# Patient Record
Sex: Female | Born: 1935 | Race: White | Hispanic: No | Smoking: Former smoker
Health system: Southern US, Community
[De-identification: ages and names within clinical notes are randomized; demographics above are authoritative.]

## PROBLEM LIST (undated history)

## (undated) DIAGNOSIS — K259 Gastric ulcer, unspecified as acute or chronic, without hemorrhage or perforation: Secondary | ICD-10-CM

## (undated) DIAGNOSIS — G9340 Encephalopathy, unspecified: Secondary | ICD-10-CM

## (undated) DIAGNOSIS — I10 Essential (primary) hypertension: Secondary | ICD-10-CM

## (undated) DIAGNOSIS — D509 Iron deficiency anemia, unspecified: Secondary | ICD-10-CM

## (undated) DIAGNOSIS — I6529 Occlusion and stenosis of unspecified carotid artery: Secondary | ICD-10-CM

## (undated) DIAGNOSIS — K2970 Gastritis, unspecified, without bleeding: Secondary | ICD-10-CM

## (undated) DIAGNOSIS — N183 Chronic kidney disease, stage 3 (moderate): Secondary | ICD-10-CM

## (undated) DIAGNOSIS — D62 Acute posthemorrhagic anemia: Secondary | ICD-10-CM

## (undated) DIAGNOSIS — E079 Disorder of thyroid, unspecified: Secondary | ICD-10-CM

## (undated) DIAGNOSIS — M1990 Unspecified osteoarthritis, unspecified site: Secondary | ICD-10-CM

## (undated) DIAGNOSIS — R9431 Abnormal electrocardiogram [ECG] [EKG]: Secondary | ICD-10-CM

## (undated) DIAGNOSIS — I248 Other forms of acute ischemic heart disease: Secondary | ICD-10-CM

## (undated) HISTORY — PX: ABDOMINAL HYSTERECTOMY: SHX81

## (undated) HISTORY — DX: Unspecified osteoarthritis, unspecified site: M19.90

---

## 2015-02-24 ENCOUNTER — Telehealth: Payer: Self-pay | Admitting: *Deleted

## 2015-02-24 NOTE — Telephone Encounter (Signed)
I have spoken to patient's daughter to advise of time and date of appointment with Lawson FiscalLori Hvozdovic, PA-C. She verbalizes understanding. [place] and will have records sent here before appointment.

## 2015-02-24 NOTE — Telephone Encounter (Signed)
-----   Message from Beverley FiedlerJay M Pyrtle, MD sent at 02/24/2015 12:30 PM EDT ----- Regarding: New pt This lady is family friend of Josh Monday She recently dx with PUD, bleeding Needs to be seen, was told she would need to wait until Jan. Needs to be fit in with me or APP while I am in clinic. Her daughter is Sara ChuJennifer Welch is the contact at [phone]  Thanks JMP

## 2015-03-02 ENCOUNTER — Inpatient Hospital Stay (HOSPITAL_COMMUNITY)
Admission: EM | Admit: 2015-03-02 | Discharge: 2015-03-06 | DRG: 377 | Disposition: A | Discharge status: Home Health | Payer: Medicare Other | Attending: Internal Medicine | Admitting: Internal Medicine

## 2015-03-02 ENCOUNTER — Emergency Department (HOSPITAL_COMMUNITY): Payer: Medicare Other

## 2015-03-02 ENCOUNTER — Inpatient Hospital Stay (HOSPITAL_COMMUNITY): Payer: Medicare Other

## 2015-03-02 ENCOUNTER — Encounter (HOSPITAL_COMMUNITY): Payer: Self-pay | Admitting: Emergency Medicine

## 2015-03-02 DIAGNOSIS — R531 Weakness: Secondary | ICD-10-CM | POA: Diagnosis not present

## 2015-03-02 DIAGNOSIS — Z800 Family history of malignant neoplasm of digestive organs: Secondary | ICD-10-CM | POA: Diagnosis not present

## 2015-03-02 DIAGNOSIS — K922 Gastrointestinal hemorrhage, unspecified: Secondary | ICD-10-CM | POA: Diagnosis not present

## 2015-03-02 DIAGNOSIS — I248 Other forms of acute ischemic heart disease: Secondary | ICD-10-CM | POA: Diagnosis present

## 2015-03-02 DIAGNOSIS — I6529 Occlusion and stenosis of unspecified carotid artery: Secondary | ICD-10-CM | POA: Diagnosis present

## 2015-03-02 DIAGNOSIS — D509 Iron deficiency anemia, unspecified: Secondary | ICD-10-CM | POA: Diagnosis present

## 2015-03-02 DIAGNOSIS — Z8249 Family history of ischemic heart disease and other diseases of the circulatory system: Secondary | ICD-10-CM | POA: Diagnosis not present

## 2015-03-02 DIAGNOSIS — K253 Acute gastric ulcer without hemorrhage or perforation: Secondary | ICD-10-CM | POA: Diagnosis present

## 2015-03-02 DIAGNOSIS — Z8719 Personal history of other diseases of the digestive system: Secondary | ICD-10-CM | POA: Diagnosis not present

## 2015-03-02 DIAGNOSIS — D649 Anemia, unspecified: Secondary | ICD-10-CM | POA: Diagnosis present

## 2015-03-02 DIAGNOSIS — J189 Pneumonia, unspecified organism: Secondary | ICD-10-CM | POA: Diagnosis present

## 2015-03-02 DIAGNOSIS — Y95 Nosocomial condition: Secondary | ICD-10-CM | POA: Diagnosis present

## 2015-03-02 DIAGNOSIS — Z8711 Personal history of peptic ulcer disease: Secondary | ICD-10-CM

## 2015-03-02 DIAGNOSIS — G9340 Encephalopathy, unspecified: Secondary | ICD-10-CM

## 2015-03-02 DIAGNOSIS — D62 Acute posthemorrhagic anemia: Secondary | ICD-10-CM | POA: Diagnosis present

## 2015-03-02 DIAGNOSIS — R4182 Altered mental status, unspecified: Secondary | ICD-10-CM

## 2015-03-02 DIAGNOSIS — I639 Cerebral infarction, unspecified: Secondary | ICD-10-CM

## 2015-03-02 DIAGNOSIS — K2971 Gastritis, unspecified, with bleeding: Principal | ICD-10-CM | POA: Diagnosis present

## 2015-03-02 DIAGNOSIS — R7989 Other specified abnormal findings of blood chemistry: Secondary | ICD-10-CM | POA: Diagnosis present

## 2015-03-02 DIAGNOSIS — Z79899 Other long term (current) drug therapy: Secondary | ICD-10-CM

## 2015-03-02 DIAGNOSIS — R9431 Abnormal electrocardiogram [ECG] [EKG]: Secondary | ICD-10-CM

## 2015-03-02 DIAGNOSIS — N183 Chronic kidney disease, stage 3 unspecified: Secondary | ICD-10-CM | POA: Diagnosis present

## 2015-03-02 DIAGNOSIS — K2970 Gastritis, unspecified, without bleeding: Secondary | ICD-10-CM | POA: Diagnosis present

## 2015-03-02 DIAGNOSIS — R930 Abnormal findings on diagnostic imaging of skull and head, not elsewhere classified: Secondary | ICD-10-CM

## 2015-03-02 HISTORY — DX: Acute posthemorrhagic anemia: D62

## 2015-03-02 HISTORY — DX: Iron deficiency anemia, unspecified: D50.9

## 2015-03-02 HISTORY — DX: Gastritis, unspecified, without bleeding: K29.70

## 2015-03-02 HISTORY — DX: Encephalopathy, unspecified: G93.40

## 2015-03-02 HISTORY — DX: Abnormal electrocardiogram (ECG) (EKG): R94.31

## 2015-03-02 HISTORY — DX: Other forms of acute ischemic heart disease: I24.8

## 2015-03-02 HISTORY — DX: Gastric ulcer, unspecified as acute or chronic, without hemorrhage or perforation: K25.9

## 2015-03-02 HISTORY — DX: Occlusion and stenosis of unspecified carotid artery: I65.29

## 2015-03-02 HISTORY — DX: Chronic kidney disease, stage 3 (moderate): N18.3

## 2015-03-02 LAB — URINALYSIS, ROUTINE W REFLEX MICROSCOPIC
Bilirubin Urine: NEGATIVE
Glucose, UA: NEGATIVE mg/dL
Hgb urine dipstick: NEGATIVE
KETONES UR: NEGATIVE mg/dL
LEUKOCYTES UA: NEGATIVE
NITRITE: NEGATIVE
PROTEIN: NEGATIVE mg/dL
Specific Gravity, Urine: 1.012 (ref 1.005–1.030)
Urobilinogen, UA: 0.2 mg/dL (ref 0.0–1.0)
pH: 6 (ref 5.0–8.0)

## 2015-03-02 LAB — LACTIC ACID, PLASMA: LACTIC ACID, VENOUS: 1.5 mmol/L (ref 0.5–2.0)

## 2015-03-02 LAB — CBC
HCT: 15.1 % — ABNORMAL LOW (ref 36.0–46.0)
HEMOGLOBIN: 4.7 g/dL — AB (ref 12.0–15.0)
MCH: 28.7 pg (ref 26.0–34.0)
MCHC: 31.1 g/dL (ref 30.0–36.0)
MCV: 92.1 fL (ref 78.0–100.0)
PLATELETS: 291 10*3/uL (ref 150–400)
RBC: 1.64 MIL/uL — AB (ref 3.87–5.11)
RDW: 21.3 % — ABNORMAL HIGH (ref 11.5–15.5)
WBC: 9.6 10*3/uL (ref 4.0–10.5)

## 2015-03-02 LAB — COMPREHENSIVE METABOLIC PANEL
ALK PHOS: 80 U/L (ref 38–126)
ALT: 11 U/L — AB (ref 14–54)
ANION GAP: 4 — AB (ref 5–15)
AST: 15 U/L (ref 15–41)
Albumin: 2.2 g/dL — ABNORMAL LOW (ref 3.5–5.0)
BILIRUBIN TOTAL: 0.4 mg/dL (ref 0.3–1.2)
BUN: 21 mg/dL — ABNORMAL HIGH (ref 6–20)
CALCIUM: 7.8 mg/dL — AB (ref 8.9–10.3)
CO2: 24 mmol/L (ref 22–32)
CREATININE: 0.55 mg/dL (ref 0.44–1.00)
Chloride: 111 mmol/L (ref 101–111)
Glucose, Bld: 130 mg/dL — ABNORMAL HIGH (ref 65–99)
Potassium: 3.7 mmol/L (ref 3.5–5.1)
Sodium: 139 mmol/L (ref 135–145)
TOTAL PROTEIN: 4.7 g/dL — AB (ref 6.5–8.1)

## 2015-03-02 LAB — POC OCCULT BLOOD, ED: Fecal Occult Bld: POSITIVE — AB

## 2015-03-02 LAB — MRSA PCR SCREENING: MRSA by PCR: NEGATIVE

## 2015-03-02 LAB — PROTIME-INR
INR: 1.16 (ref 0.00–1.49)
Prothrombin Time: 14.9 seconds (ref 11.6–15.2)

## 2015-03-02 LAB — TROPONIN I: Troponin I: <0.03 ng/mL (ref <0.031)

## 2015-03-02 LAB — ABO/RH: ABO/RH(D): A NEG

## 2015-03-02 LAB — PREPARE RBC (CROSSMATCH)

## 2015-03-02 MED ORDER — PANTOPRAZOLE SODIUM 40 MG IV SOLR: 40.0000 mg | Freq: Two times a day (BID) | INTRAVENOUS | Status: DC

## 2015-03-02 MED ORDER — PIPERACILLIN-TAZOBACTAM 3.375 G IVPB 30 MIN
3.3750 g | Freq: Three times a day (TID) | INTRAVENOUS | Status: DC
  Filled 2015-03-02: qty 50

## 2015-03-02 MED ORDER — FLEET ENEMA 7-19 GM/118ML RE ENEM
1.0000 | ENEMA | Freq: Once | RECTAL | Status: DC | PRN
Start: 2015-03-02 — End: 2015-03-06

## 2015-03-02 MED ORDER — ONDANSETRON HCL 4 MG/2ML IJ SOLN: 4.0000 mg | Freq: Four times a day (QID) | INTRAMUSCULAR | Status: DC | PRN

## 2015-03-02 MED ORDER — VANCOMYCIN HCL IN DEXTROSE 1-5 GM/200ML-% IV SOLN
1000.0000 mg | Freq: Once | INTRAVENOUS | Status: AC
  Administered 2015-03-02: 1000 mg via INTRAVENOUS
  Filled 2015-03-02: qty 200

## 2015-03-02 MED ORDER — SODIUM CHLORIDE 0.9 % IV SOLN
Freq: Once | INTRAVENOUS | Status: AC
Start: 2015-03-02 — End: 2015-03-02
  Administered 2015-03-02: 11:00:00 via INTRAVENOUS

## 2015-03-02 MED ORDER — OXYCODONE HCL 5 MG PO TABS
5.0000 mg | ORAL_TABLET | ORAL | Status: DC | PRN
  Administered 2015-03-03 – 2015-03-04 (×4): 5 mg via ORAL
  Filled 2015-03-02 (×4): qty 1

## 2015-03-02 MED ORDER — CARVEDILOL 3.125 MG PO TABS
3.1250 mg | ORAL_TABLET | Freq: Two times a day (BID) | ORAL | Status: DC
  Administered 2015-03-02 – 2015-03-06 (×8): 3.125 mg via ORAL
  Filled 2015-03-02 (×8): qty 1

## 2015-03-02 MED ORDER — CHLORHEXIDINE GLUCONATE 0.12 % MT SOLN
15.0000 mL | Freq: Two times a day (BID) | OROMUCOSAL | Status: DC
Start: 2015-03-03 — End: 2015-03-06
  Administered 2015-03-03 – 2015-03-06 (×7): 15 mL via OROMUCOSAL
  Filled 2015-03-02 (×7): qty 15

## 2015-03-02 MED ORDER — CETYLPYRIDINIUM CHLORIDE 0.05 % MT LIQD
7.0000 mL | Freq: Two times a day (BID) | OROMUCOSAL | Status: DC
  Administered 2015-03-03 – 2015-03-06 (×6): 7 mL via OROMUCOSAL

## 2015-03-02 MED ORDER — SENNOSIDES-DOCUSATE SODIUM 8.6-50 MG PO TABS: 1.0000 | ORAL_TABLET | Freq: Every evening | ORAL | Status: DC | PRN

## 2015-03-02 MED ORDER — FUROSEMIDE 10 MG/ML IJ SOLN
20.0000 mg | Freq: Once | INTRAMUSCULAR | Status: AC
  Administered 2015-03-02: 20 mg via INTRAVENOUS
  Filled 2015-03-02: qty 2

## 2015-03-02 MED ORDER — PIPERACILLIN-TAZOBACTAM 3.375 G IVPB
3.3750 g | Freq: Once | INTRAVENOUS | Status: AC
  Administered 2015-03-02: 3.375 g via INTRAVENOUS
  Filled 2015-03-02: qty 50

## 2015-03-02 MED ORDER — PIPERACILLIN-TAZOBACTAM 3.375 G IVPB
3.3750 g | Freq: Three times a day (TID) | INTRAVENOUS | Status: DC
  Administered 2015-03-02 – 2015-03-05 (×9): 3.375 g via INTRAVENOUS
  Filled 2015-03-02 (×6): qty 50

## 2015-03-02 MED ORDER — ALBUTEROL SULFATE (2.5 MG/3ML) 0.083% IN NEBU
2.5000 mg | INHALATION_SOLUTION | RESPIRATORY_TRACT | Status: DC | PRN
Start: 2015-03-02 — End: 2015-03-06

## 2015-03-02 MED ORDER — STROKE: EARLY STAGES OF RECOVERY BOOK
Freq: Once | Status: AC
  Administered 2015-03-02: 17:00:00
  Filled 2015-03-02: qty 1

## 2015-03-02 MED ORDER — ONDANSETRON HCL 4 MG PO TABS: 4.0000 mg | ORAL_TABLET | Freq: Four times a day (QID) | ORAL | Status: DC | PRN

## 2015-03-02 MED ORDER — VANCOMYCIN HCL 500 MG IV SOLR
500.0000 mg | Freq: Two times a day (BID) | INTRAVENOUS | Status: DC
  Administered 2015-03-03 – 2015-03-05 (×6): 500 mg via INTRAVENOUS
  Filled 2015-03-02 (×6): qty 500

## 2015-03-02 MED ORDER — ACETAMINOPHEN 500 MG PO TABS: 500.0000 mg | ORAL_TABLET | Freq: Four times a day (QID) | ORAL | Status: DC | PRN

## 2015-03-02 MED ORDER — SODIUM CHLORIDE 0.9 % IV SOLN
8.0000 mg/h | INTRAVENOUS | Status: DC
  Administered 2015-03-02 – 2015-03-03 (×2): 8 mg/h via INTRAVENOUS
  Filled 2015-03-02 (×3): qty 80

## 2015-03-02 MED ORDER — PANTOPRAZOLE SODIUM 40 MG IV SOLR
80.0000 mg | Freq: Once | INTRAVENOUS | Status: AC
  Administered 2015-03-02: 80 mg via INTRAVENOUS
  Filled 2015-03-02: qty 80

## 2015-03-02 MED ORDER — SORBITOL 70 % SOLN
30.0000 mL | Freq: Every day | Status: DC | PRN
  Administered 2015-03-05: 30 mL via ORAL
  Filled 2015-03-02: qty 30

## 2015-03-02 MED ORDER — SODIUM CHLORIDE 0.9 % IJ SOLN
3.0000 mL | Freq: Two times a day (BID) | INTRAMUSCULAR | Status: DC
  Administered 2015-03-02 – 2015-03-06 (×3): 3 mL via INTRAVENOUS

## 2015-03-02 NOTE — H&P (Signed)
[facility] History and Physical  Erica Owen NWG:[id number] DOB: 12/15/1930 DOA: 03/02/2015  Referring physician: Dr Patria Mane PCP: Nadean Corwin, MD   Chief Complaint: Fatigue  HPI: Erica Owen is a 81 y.o. female  With no significant prior medical history as per daughter hasn't seen a doctor 55 years presenting to the ED with generalized fatigue and confusion. Patient recently hospitalized at [facility] for 2 nonbleeding gastric ulcers that were created with a clean ulcer base one measuring 10 mm and another one 12 mm the EGD with gastritis. Patient received a total of 6 units packed red blood cells during the hospitalization and on discharge hemoglobin went from 4.7 on admission to 9.3 on discharge, patient was hospitalized there from 02/21/2015 - 02/24/2015. Patient was subsequently discharged home on PPI and Carafate and per family required cauterization. Patient had doing been doing better and per family was noted to have a cough prior to discharge however had 2 negative chest x-rays. Patient presented to the ED today secondary to worsening generalized weakness and increasing confusion per family the patient was noted to be disoriented and very weak. Patient denied any fevers, no chills, no nausea, no vomiting, no chest pain, no shortness of breath, no abdominal pain, no diarrhea, no constipation, no melena, no hematemesis, no hematochezia, no dizziness, no lightheadedness. Patient does endorse a nonproductive cough. Patient denies any NSAIDs use. Patient states when he uses Tylenol for pain. Patient was seen in the emergency room CBC done had a hemoglobin of 4.7 MCV of 92.1 otherwise was within normal limits. Compressive metabolic profile had a BUN of 21 calcium of 7.8 glucose of 130 abdomen of 2.2 ALT of 11 total protein of 4.7 otherwise was within normal limits. Lactic acid was 1.5. Troponin was less than 0.03. Urinalysis was nitrite negative leukocytes negative. Chest x-ray was  concerning for posterior left basilar pneumonia. CT head showed a suspicion for small early infarct at the gray-white junction of the right posterior frontal-anterior temporal junction. No hemorrhage or mass effect. 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 EKG showed ST depression in leads 2,3, aVF, V4 through V6 and T-wave inversions in leads 1,2 ,V4 through V6. ED physician consulted with gastroenterology. Patient was started on transfusion of 3 units of packed red blood cells. [facility] were called to admit the patient for further evaluation and management.     Review of Systems: Per HPI otherwise negative. Constitutional:  No weight loss, night sweats, Fevers, chills, fatigue.  HEENT:  No headaches, Difficulty swallowing,Tooth/dental problems,Sore throat,  No sneezing, itching, ear ache, nasal congestion, post nasal drip,  Cardio-vascular:  No chest pain, Orthopnea, PND, swelling in lower extremities, anasarca, dizziness, palpitations  GI:  No heartburn, indigestion, abdominal pain, nausea, vomiting, diarrhea, change in bowel habits, loss of appetite  Resp:  No shortness of breath with exertion or at rest. No excess mucus, no productive cough, No non-productive cough, No coughing up of blood.No change in color of mucus.No wheezing.No chest wall deformity  Skin:  no rash or lesions.  GU:  no dysuria, change in color of urine, no urgency or frequency. No flank pain.  Musculoskeletal:  No joint pain or swelling. No decreased range of motion. No back pain.  Psych:  No change in mood or affect. No depression or anxiety. No memory loss.   Past Medical History  Diagnosis Date  . Gastric ulcer   . Acute blood loss anemia 03/02/2015   History reviewed. No pertinent past surgical  history. Social History:  reports that she has never smoked. She does not have any smokeless tobacco history on file. She reports that she does not drink alcohol or use illicit  drugs.  No Known Allergies  Family History  Problem Relation Age of Onset  . Lupus Mother   . Kidney failure Mother   . Heart attack Father       Prior to Admission medications   Medication Sig Start Date End Date Taking? Authorizing Provider  acetaminophen (TYLENOL) 500 MG tablet Take 500 mg by mouth every 6 (six) hours as needed for mild pain.   Yes Historical Provider, MD  pantoprazole (PROTONIX) 40 MG tablet Take 40 mg by mouth 2 (two) times daily.  02/24/15  Yes Historical Provider, MD  sucralfate (CARAFATE) 1 G tablet Take 1 g by mouth 4 (four) times daily -  with meals and at bedtime.  02/24/15  Yes Historical Provider, MD   Physical Exam: Filed Vitals:   03/02/15 1215 03/02/15 1230 03/02/15 1257 03/02/15 1300  BP: 114/67 130/76  133/70  Pulse: 91 94  93  Temp: 99.1 F (37.3 C)  99.1 F (37.3 C)   TempSrc:      Resp: SpO2: 97% 100%  98%    Wt Readings from Last 3 Encounters:  No data found for Wt    General:  Well developed, well nourished, speaking in full sentences in no pulmonary distress. Eyes: PERRLA, EOMI, normal lids, irises & conjunctiva ENT: grossly normal hearing, lips & tongue. Dry mucous membranes. Neck: no LAD, masses or thyromegaly Cardiovascular: RRR, no m/r/g. No LE edema.  Respiratory: CTA bilaterally, no w/r/r. Normal respiratory effort. Abdomen: soft, ntnd, positive bowel sounds, no rebound, no guarding Skin: no rash or induration seen on limited exam Musculoskeletal: grossly normal tone BUE/BLE Psychiatric: grossly normal mood and affect, speech fluent and appropriate Neurologic: Alert and attempted to self and place. Cranial nerves II through XII grossly intact. Sensation is intact. Visual fields are intact. Finger-to-nose is intact. Gait not tested secondary to safety.           Labs on Admission:  Basic Metabolic Panel:  Recent Labs Lab 03/02/15 0902  NA 139  K 3.7  CL 111  CO2 24  GLUCOSE 130*  BUN 21*  CREATININE  0.55  CALCIUM 7.8*   Liver Function Tests:  Recent Labs Lab 03/02/15 0902  AST 15  ALT 11*  ALKPHOS 80  BILITOT 0.4  PROT 4.7*  ALBUMIN 2.2*   No results for input(s): LIPASE, AMYLASE in the last 168 hours. No results for input(s): AMMONIA in the last 168 hours. CBC:  Recent Labs Lab 03/02/15 0902  WBC 9.6  HGB 4.7*  HCT 15.1*  MCV 92.1  PLT 291   Cardiac Enzymes:  Recent Labs Lab 03/02/15 0902  TROPONINI <0.03    BNP (last 3 results) No results for input(s): BNP in the last 8760 hours.  ProBNP (last 3 results) No results for input(s): PROBNP in the last 8760 hours.  CBG: No results for input(s): GLUCAP in the last 168 hours.  Radiological Exams on Admission: Dg Chest 2 View  03/02/2015  CLINICAL DATA:  Cough and fever.  Altered mental status. EXAM: CHEST  2 VIEW COMPARISON:  None. FINDINGS: There is airspace consolidation in the posterior left base. Lungs elsewhere clear. Heart size and pulmonary vascularity are normal. No adenopathy. There is upper thoracic levoscoliosis. IMPRESSION: Airspace consolidation consistent with pneumonia, posterior left base.  Followup PA and lateral chest radiographs recommended in 3-4 weeks following trial of antibiotic therapy to ensure resolution and exclude underlying malignancy. Electronically Signed   By: Bretta BangWilliam  Woodruff III M.D.   On: 03/02/2015 10:20   Ct Head Wo Contrast  03/02/2015  CLINICAL DATA:  Altered mental status EXAM: CT HEAD WITHOUT CONTRAST TECHNIQUE: Contiguous axial images were obtained from the base of the skull through the vertex without intravenous contrast. COMPARISON:  None. FINDINGS: There is age related volume loss. There is no intracranial mass hemorrhage, extra-axial fluid collection, or midline shift. There is a prior focal infarct in the anterior limb of the right internal capsule. There is slight small vessel disease in the centra semiovale bilaterally. On axial slice 14 series 2, there is a small  focus of decreased attenuation at the gray - white compartment junction of the right frontal -temporal lobe. This area may represent a small acute infarct. Gray-white compartments elsewhere appear normal. The bony calvarium appears intact. The mastoid air cells are clear. IMPRESSION: Suspect small early infarct at the gray-white junction of the right posterior frontal -anterior temporal junction. Slight periventricular small vessel disease. 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 No hemorrhage or mass effect. Electronically Signed   By: Bretta BangWilliam  Woodruff III M.D.   On: 03/02/2015 11:04    EKG: Independently reviewed. ST depression in leads 2, 3, aVF, V4 through V6. T-wave inversion in leads 1, 2, V4 through V6.  Assessment/Plan Principal Problem:   UGIB (upper gastrointestinal bleed) Active Problems:   Symptomatic anemia   Abnormal EKG   HCAP (healthcare-associated pneumonia)   Acute blood loss anemia   GI bleed   GIB (gastrointestinal bleeding)   Acute encephalopathy  #1 upper GI bleed Patient presenting with generalized fatigue noted to have a hemoglobin of 4.7 on admission FOBT was positive. [place] [facility] [place] [place] where patient underwent upper endoscopy that revealed 2 ulcers in the gastric cardia and fundus as well as gastritis noted. Patient was transfused a total of 6 units of packed red blood cells and on discharge from the hospital on 02/24/2015 patient's hemoglobin was 9.3. H pylori antibody was checked which was negative. Patient denies any NSAID use. Will admit patient to the step down unit. Place on IV fluids. Place on a Protonix drip. We'll hold Carafate for now. Gastroenterology has been consulted for further evaluation and management. Patient will likely undergo upper endoscopy. Patient is currently being transfused 3 units of packed red blood cells. Follow.  #2 symptomatic anemia/acute blood loss anemia Secondary to problem #1.  Patient's hemoglobin currently at 4.7. Patient[place] [facility] [place] was 9.3 on 02/24/2015. Patient had a upper endoscopy done during that hospitalization noted to have gastric ulcers and placed on a PPI. Patient denies any NSAID use. Patient compliant with a posthospitalization medications. Patient is being transfused 3 units of packed red blood cells. Check CBC serially. Place on a Protonix drip. GI has been consulted. See problem #1.  #3 healthcare associated pneumonia Per chest x-ray. Per family patient was noted to be coughing on discharge from prior hospitalization however chest x-rays which were done 2 at the outside hospital were negative. Patient noted to have a pneumonia in the posterior left base on admission chest x-ray. Check a sputum Gram stain and culture. Check a urine Legionella antigen. Check a urine pneumococcus antigen. Placed empirically on IV vancomycin and IV Zosyn. Place on oxygen, Mucinex. Supportive care. Symptomatic treatment.  #  4 acute encephalopathy/??CVA per CT head May be secondary to problem #1 and 3. CT head concerning for possible acute CVA. Will check MRI/MRA head, check 2-D echo and carotid Dopplers. Check a fasting lipid panel. Check a hemoglobin A1c. Check a RPR, RBC folate, B-12 levels. PT/OT/ST. Unable to place on antiplatelet regimen secondary to problem #1. If MRI is positive for an acute stroke we'll get a formal neurology consult.  #5 abnormal EKG EKG with changes concerning for ischemia with some ST depression likely secondary to demand ischemia secondary to symptomatic anemia. Patient currently chest pain-free. Will cycle cardiac enzymes every 6 hours 3. Check a fasting lipid panel. Check a 2-D echo. If 2-D echo is abnormal or enzymes are elevated will get a formal cardiology consultation. Unable to place on aspirin at this time secondary to problem #1. Will place on low dose beta blocker. Follow.  #6  prophylaxis PPI for GI prophylaxis. SCDs for DVT prophylaxis.  Code Status: full DVT Prophylaxis: SCD Family Communication: Updated patient, daughter, granddaughter at bedside. Disposition Plan: Admit to SDU  Time spent: 44 MINS  [facility] MD [facility] Pager [phone]

## 2015-03-02 NOTE — ED Notes (Addendum)
Patient gone to radiology, will do rectal temp and I&O cath when she returns

## 2015-03-02 NOTE — Progress Notes (Signed)
VASCULAR LAB PRELIMINARY  PRELIMINARY  PRELIMINARY  PRELIMINARY  Carotid duplex  completed.    Preliminary report:  Right:  40-59% internal carotid artery stenosis.   Left:  1-39% ICA stenosis.  Right:  Vertebral artery  is bidirectional. Brachial waveform is abnormal.  Left: Vertebral artery flow is antegrade.    Dairon Procter, RVT 03/02/2015, 2:41 PM

## 2015-03-02 NOTE — ED Notes (Signed)
Per family, states patient was altered this am-[facility] and received 4 units of blood-has follow up appointment tomorrow with her PCP-ortho static upon EMS's arrival-oriented x3

## 2015-03-02 NOTE — ED Notes (Signed)
NT at bedside performing care

## 2015-03-02 NOTE — ED Notes (Signed)
Bed: [id number] Expected date:  Expected time:  Means of arrival:  Comments: Ems - orthostatic, recent bleeding ulcer

## 2015-03-02 NOTE — Consult Note (Signed)
Referring Provider:  [facility] Primary Care Physician:  Nadean Corwin, MD Primary Gastroenterologist:  Gentry Fitz (Pt family requests [facility])  Reason for Consultation:  GI bleed    HPI: Erica Owen is a 81 y.o. female who is followed at [facility].She is accompanied by her daughter and granddaughter who help provide history.Erica Owen has apparently been in her usual state of good health until this past Monday, 02/21/2015,when while vacationing in [place] she began to feel very dizzy.She was seen at the [facility], and was found to have a hemoglobin of 4.5. She received 4 units of packed red blood cells. She had an upper endoscopy that revealed 3 ulcers, all of which were cauterized per history provided by daughter.patient states that prior to that episode she had not had anyepigastric pain, nausea, or vomiting. She did notice dark stools for 2 or 3 days prior to that episode. She denies a prior history of ulcers and denies use of nonsteroidal anti-inflammatory drugs. She was discharged home last Thursday with prescriptions for Protonix40 mg twice daily and Carafate 1 g before meals and at bedtime. She was advised to follow up with GI and had an appointment to be seen at [facility] next Tuesday. She felt well over the weekend, but yesterday he again began to feel very tired. This morning she felt very weak and was noted to have altered mental status. She was brought to the emergency room and found to have a hemoglobin of 4.9 with heme-positive stools. She had a CT of her head which was is questionable for a subtle acute infarct?secondary to low-flow states date/anemia. Chest x-ray revealed pneumonia. Erica Owen denies any change in her bowel habits or stool caliber. She denies any bright red blood per rectum. Her appetite as been good and her weight has been stable. She has never had a colonoscopy, but states that her brother had  colon cancer in his 46s or early  42s.   Past Medical History  Diagnosis Date  . Gastric ulcer     History reviewed. No pertinent past surgical history.  Prior to Admission medications   Medication Sig Start Date End Date Taking? Authorizing Provider  acetaminophen (TYLENOL) 500 MG tablet Take 500 mg by mouth every 6 (six) hours as needed for mild pain.   Yes Historical Provider, MD  pantoprazole (PROTONIX) 40 MG tablet Take 40 mg by mouth 2 (two) times daily.  02/24/15  Yes Historical Provider, MD  sucralfate (CARAFATE) 1 G tablet Take 1 g by mouth 4 (four) times daily -  with meals and at bedtime.  02/24/15  Yes Historical Provider, MD    Current Facility-Administered Medications  Medication Dose Route Frequency Provider Last Rate Last Dose  . piperacillin-tazobactam (ZOSYN) IVPB 3.375 g  3.375 g Intravenous Once Azalia Bilis, MD 12.5 mL/hr at 03/02/15 1137 3.375 g at 03/02/15 1137  . vancomycin (VANCOCIN) IVPB 1000 mg/200 mL premix  1,000 mg Intravenous Once Azalia Bilis, MD       Current Outpatient Prescriptions  Medication Sig Dispense Refill  . acetaminophen (TYLENOL) 500 MG tablet Take 500 mg by mouth every 6 (six) hours as needed for mild pain.    . pantoprazole (PROTONIX) 40 MG tablet Take 40 mg by mouth 2 (two) times daily.   1  . sucralfate (CARAFATE) 1 G tablet Take 1 g by mouth 4 (four) times daily -  with meals and at bedtime.   0    Allergies as of 03/02/2015  . (  No Known Allergies)    No family history on file.  Social History   Social History  . Marital Status: Widowed    Spouse Name: N/A  . Number of Children: N/A  . Years of Education: N/A   Occupational History  . Not on file.   Social History Main Topics  . Smoking status: Never Smoker   . Smokeless tobacco: Not on file  . Alcohol Use: No  . Drug Use: Not on file  . Sexual Activity: Not on file   Other Topics Concern  . Not on file   Social History Narrative  . No narrative on file    Review of Systems: Gen: Denies any  fever, chills, sweats, anorexia, weight loss, and sleep disorder.hhas felt weak and fatigued CV: Denies chest pain, angina, palpitations, syncope, orthopnea, PND, peripheral edema, and claudication. Resp: Denies cough, sputum, wheezing, coughing up blood, and pleurisy.has had some dyspnea with activity GI: Denies vomiting blood, jaundice, and fecal incontinence.   Denies dysphagia or odynophagia. GU : Denies urinary burning, blood in urine, urinary frequency, urinary hesitancy, nocturnal urination, and urinary incontinence. MS: Denies joint pain, limitation of movement, and swelling, stiffness, low back pain, extremity pain. Denies muscle weakness, cramps, atrophy.  Derm: Denies rash, itching, dry skin, hives, moles, warts, or unhealing ulcers.  Psych: Denies depression, anxiety, memory loss, suicidal ideation, hallucinations, paranoia, and confusion. Heme: Denies bruisingand enlarged lymph nodes. Neuro:  Denies any headaches,  paresthesias. Endo:  Denies any problems with DM, thyroid, adrenal function.  Physical Exam: Vital signs in last 24 hours: Temp:  [98.4 F (36.9 C)-99.7 F (37.6 C)] 98.4 F (36.9 C) (11/02 1130) Pulse Rate:  [94-102] 94 (11/02 1130) Resp:  [16-32] 25 (11/02 1130) BP: (101-145)/(45-79) 106/56 mmHg (11/02 1130) SpO2:  [92 %-99 %] 98 % (11/02 1130)   General:   Alert, pale, frail-appearing elderly female in no apparent distress Head:  Normocephalic and atraumatic. Eyes:  Sclera clear, no icterus.Conjunctiva pale. Ears:  Normal auditory acuity. Nose:  No deformity, discharge,  or lesions. Mouth:  No deformity or lesions.   Neck:  Supple; no masses or thyromegaly. Lungs:  Clear throughout to auscultation.    Heart:  Regular rate and rhythm; no murmurs, clicks, rubs,  or gallops. Abdomen:  Soft,nontender, BS active,nonpalp mass or hsm.   Rectal:  Deferred ,stool for occult blood positive on sample sent to lab Msk:  Symmetrical without gross deformities. . Pulses:   Normal pulses noted. Extremities:  Without clubbing or edema. Neurologic: Alert and  Oriented,  grossly normal neurologically. Skin: Intact without significant lesions or rashes.. Psych: Alert and cooperative. Normal mood and affect.  Intake/Output from previous day:   Intake/Output this shift: Total I/O In: 335 [Blood:335] Out: 600 [Urine:600]  Lab Results:  Recent Labs  03/02/15 0902  WBC 9.6  HGB 4.7*  HCT 15.1*  PLT 291   BMET  Recent Labs  03/02/15 0902  NA 139  K 3.7  CL 111  CO2 24  GLUCOSE 130*  BUN 21*  CREATININE 0.55  CALCIUM 7.8*   LFT  Recent Labs  03/02/15 0902  PROT 4.7*  ALBUMIN 2.2*  AST 15  ALT 11*  ALKPHOS 80  BILITOT 0.4   PT/INR  Recent Labs  03/02/15 0902  LABPROT 14.9  INR 1.16    Studies/Results: Dg Chest 2 View  03/02/2015  CLINICAL DATA:  Cough and fever.  Altered mental status. EXAM: CHEST  2 VIEW COMPARISON:  None. FINDINGS: There  is airspace consolidation in the posterior left base. Lungs elsewhere clear. Heart size and pulmonary vascularity are normal. No adenopathy. There is upper thoracic levoscoliosis. IMPRESSION: Airspace consolidation consistent with pneumonia, posterior left base. Followup PA and lateral chest radiographs recommended in 3-4 weeks following trial of antibiotic therapy to ensure resolution and exclude underlying malignancy. Electronically Signed   By: Bretta Bang III M.D.   On: 03/02/2015 10:20   Ct Head Wo Contrast  03/02/2015  CLINICAL DATA:  Altered mental status EXAM: CT HEAD WITHOUT CONTRAST TECHNIQUE: Contiguous axial images were obtained from the base of the skull through the vertex without intravenous contrast. COMPARISON:  None. FINDINGS: There is age related volume loss. There is no intracranial mass hemorrhage, extra-axial fluid collection, or midline shift. There is a prior focal infarct in the anterior limb of the right internal capsule. There is slight small vessel disease in the  centra semiovale bilaterally. On axial slice 14 series 2, there is a small focus of decreased attenuation at the gray - white compartment junction of the right frontal -temporal lobe. This area may represent a small acute infarct. Gray-white compartments elsewhere appear normal. The bony calvarium appears intact. The mastoid air cells are clear. IMPRESSION: Suspect small early infarct at the gray-white junction of the right posterior frontal -anterior temporal junction. Slight periventricular small vessel disease. 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 No hemorrhage or mass effect. Electronically Signed   By: Bretta Bang III M.D.   On: 03/02/2015 11:04    IMPRESSION/PLAN: #1.Anemia and heme-positive stool.Patient is status post admission to [facility] in [facility] 1 week ago with upper GI bleed secondary to ulcers.patient was discharged home on Protonix and Carafate and had an appointment to follow-up with GI next week.Unfortunately, she developed weakness and altered mental status this morning, and was brought to the ER where she was found to have hemoglobin of 4.9 and heme-positive stools. Bleeding likely secondary to her ulcers. Recommend protonix drip.Will need repeat EGDwhen hemodynamically stable, likely tomorrow.Will review with attending has to likelihood of colonoscopy at the same setting due to  Significant drop in hemoglobin over the past week and family history of colon cancer.would transfuse to hemoglobinof 8 or higher. Trend hemoglobin.Pt has signed a medical release to obtain EGD report and discharge summary from [facility].  #2. Pneumonia.Patient has been startedon Zosyn and vancomycin by ER.  #3. Altered mental status. May be secondary to #2,or possibly low-flow state.     Erica Owen, Moise Boring 03/02/2015,  Pager [phone]  Mon-Fri 8a-5p [phone] after 5p, weekends, holidays

## 2015-03-02 NOTE — ED Notes (Addendum)
Registration in with patient.

## 2015-03-02 NOTE — ED Provider Notes (Signed)
CSN: [id number]     Arrival date & time 03/02/15  4098 History   First MD Initiated Contact with Patient 03/02/15 [id number]     Chief Complaint  Patient presents with  . Fatigue     Level V caveat: Altered mental status  HPI Patient was hospitalized last week an outside hospital in [place] for altered mental status was found to have a GI bleed requiring upper endoscopy and cauterization of 3 ulcers per family.  Patient received blood transfusion at that time.  She was discharged home and had been doing better.  Just prior to discharge she did have a fever in the emergency department but family reports 2 negative chest x-rays.  Family does report cough.  She is brought to the emergency department today for increasing generalized weakness and increasing confusion which is worse today than yesterday.  Patient is normally alert and oriented 3.  Currently she notes she is at a hospital but does not know the year and does not know which hospital.  No reports of vomiting.  No use of anticoagulants.  Pejorative history obtained from the chart and patient's family   Past Medical History  Diagnosis Date  . Gastric ulcer    History reviewed. No pertinent past surgical history. No family history on file. Social History  Substance Use Topics  . Smoking status: Never Smoker   . Smokeless tobacco: None  . Alcohol Use: No   OB History    No data available     Review of Systems  Unable to perform ROS: Mental status change      Allergies  Review of patient's allergies indicates no known allergies.  Home Medications   Prior to Admission medications   Medication Sig Start Date End Date Taking? Authorizing Provider  acetaminophen (TYLENOL) 500 MG tablet Take 500 mg by mouth every 6 (six) hours as needed for mild pain.   Yes Historical Provider, MD  pantoprazole (PROTONIX) 40 MG tablet Take 40 mg by mouth 2 (two) times daily.  02/24/15  Yes Historical Provider, MD  sucralfate (CARAFATE) 1 G  tablet Take 1 g by mouth 4 (four) times daily -  with meals and at bedtime.  02/24/15  Yes Historical Provider, MD   BP 114/61 mmHg  Pulse 97  Temp(Src) 99 F (37.2 C) (Oral)  Resp 18  SpO2 97% Physical Exam  Constitutional: She appears well-developed and well-nourished. No distress.  HENT:  Head: Normocephalic and atraumatic.  Eyes: EOM are normal.  Neck: Normal range of motion.  Cardiovascular: Normal rate, regular rhythm and normal heart sounds.   Pulmonary/Chest: Effort normal and breath sounds normal.  Abdominal: Soft. She exhibits no distension. There is no tenderness.  Musculoskeletal: Normal range of motion.  Neurological: She is alert.  Follows commands.  5 out of 5 strength in bilateral upper and lower extremity major muscle groups  Skin: Skin is warm and dry. There is pallor.  Psychiatric: She has a normal mood and affect. Judgment normal.  Nursing note and vitals reviewed.   ED Course  Procedures (including critical care time)   CRITICAL CARE Performed by: Lyanne Co Total critical care time: 33 minutes Critical care time was exclusive of separately billable procedures and treating other patients. Critical care was necessary to treat or prevent imminent or life-threatening deterioration. Critical care was time spent personally by me on the following activities: development of treatment plan with patient and/or surrogate as well as nursing, discussions with consultants, evaluation of patient's response  to treatment, examination of patient, obtaining history from patient or surrogate, ordering and performing treatments and interventions, ordering and review of laboratory studies, ordering and review of radiographic studies, pulse oximetry and re-evaluation of patient's condition.   Labs Review Labs Reviewed  COMPREHENSIVE METABOLIC PANEL - Abnormal; Notable for the following:    Glucose, Bld 130 (*)    BUN 21 (*)    Calcium 7.8 (*)    Total Protein 4.7 (*)     Albumin 2.2 (*)    ALT 11 (*)    Anion gap 4 (*)    All other components within normal limits  CBC - Abnormal; Notable for the following:    RBC 1.64 (*)    Hemoglobin 4.7 (*)    HCT 15.1 (*)    RDW 21.3 (*)    All other components within normal limits  POC OCCULT BLOOD, ED - Abnormal; Notable for the following:    Fecal Occult Bld POSITIVE (*)    All other components within normal limits  CULTURE, BLOOD (ROUTINE X 2)  CULTURE, BLOOD (ROUTINE X 2)  URINE CULTURE  TROPONIN I  PROTIME-INR  LACTIC ACID, PLASMA  URINALYSIS, ROUTINE W REFLEX MICROSCOPIC (NOT AT [facility])  TYPE AND SCREEN  ABO/RH  PREPARE RBC (CROSSMATCH)    Imaging Review Dg Chest 2 View  03/02/2015  CLINICAL DATA:  Cough and fever.  Altered mental status. EXAM: CHEST  2 VIEW COMPARISON:  None. FINDINGS: There is airspace consolidation in the posterior left base. Lungs elsewhere clear. Heart size and pulmonary vascularity are normal. No adenopathy. There is upper thoracic levoscoliosis. IMPRESSION: Airspace consolidation consistent with pneumonia, posterior left base. Followup PA and lateral chest radiographs recommended in 3-4 weeks following trial of antibiotic therapy to ensure resolution and exclude underlying malignancy. Electronically Signed   By: Bretta Bang III M.D.   On: 03/02/2015 10:20   Ct Head Wo Contrast  03/02/2015  CLINICAL DATA:  Altered mental status EXAM: CT HEAD WITHOUT CONTRAST TECHNIQUE: Contiguous axial images were obtained from the base of the skull through the vertex without intravenous contrast. COMPARISON:  None. FINDINGS: There is age related volume loss. There is no intracranial mass hemorrhage, extra-axial fluid collection, or midline shift. There is a prior focal infarct in the anterior limb of the right internal capsule. There is slight small vessel disease in the centra semiovale bilaterally. On axial slice 14 series 2, there is a small focus of decreased attenuation at the gray - white  compartment junction of the right frontal -temporal lobe. This area may represent a small acute infarct. Gray-white compartments elsewhere appear normal. The bony calvarium appears intact. The mastoid air cells are clear. IMPRESSION: Suspect small early infarct at the gray-white junction of the right posterior frontal -anterior temporal junction. Slight periventricular small vessel disease. 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 No hemorrhage or mass effect. Electronically Signed   By: Bretta Bang III M.D.   On: 03/02/2015 11:04   I have personally reviewed and evaluated these images and lab results as part of my medical decision-making.   EKG Interpretation   Date/Time:  Wednesday March 02 2015 08:53:50 EDT Ventricular Rate:  101 PR Interval:  173 QRS Duration: 77 QT Interval:  363 QTC Calculation: 470 R Axis:   67 Text Interpretation:  Sinus tachycardia Repol abnrm suggests ischemia,  diffuse leads no prior ecg availble for comparison Confirmed by Estefan Pattison   MD, Kenshawn Maciolek ([id number]) on 03/02/2015 11:14:36 AM  MDM   Final diagnoses:  Anemia, unspecified anemia type  Upper GI bleed  Altered mental status, unspecified altered mental status type  HCAP (healthcare-associated pneumonia)  Abnormal head CT    I spoke with [facility] will evaluate the patient and she will likely require repeat endoscopy.  Currently she is hemodynamically stable.  Her lactate is normal.  She is receiving blood transfusion at this time for hemoglobin of 4.7.  She does have inferolateral ischemic changes but she denies chest pain at this time and has a normal troponin.  No prior EKGs to compare to.  This could be her baseline at this could be supply demand from anemia.  CT scan is questionable for a subtle acute infarct.  This could be secondary to anemia and low flow state.  Patient will need an MRI at some point during the hospitalization and likely neurology consultation.   Stroke swallow study now.  Patient with evidence of pneumonia on chest x-ray and much for mental status could be secondary to delirium from infection.  Patient be covered with vancomycin and Zosyn for healthcare associated pneumonia given her recent hospitalization.  Ongoing blood transfusion at this time.  Admit to stepdown unit    Azalia BilisKevin Yoshi Vicencio, MD 03/02/15 1122

## 2015-03-02 NOTE — Progress Notes (Signed)
ANTIBIOTIC CONSULT NOTE - INITIAL  Pharmacy Consult for Vancomycin, Antibiotic renal dose adjustment Indication: pneumonia  No Known Allergies  Patient Measurements: Height: 5\' 4"  (162.6 cm) Weight: 114 lb 13.8 oz (52.1 kg) IBW/kg (Calculated) : 54.7 Adjusted Body Weight:   Vital Signs: Temp: 98.5 F (36.9 C) (11/02 1326) Temp Source: Oral (11/02 1326) BP: 146/62 mmHg (11/02 1400) Pulse Rate: 85 (11/02 1400) Intake/Output from previous day:   Intake/Output from this shift: Total I/O In: 335 [Blood:335] Out: 600 [Urine:600]  Labs:  Recent Labs  03/02/15 0902  WBC 9.6  HGB 4.7*  PLT 291  CREATININE 0.55   Estimated Creatinine Clearance: 44.6 mL/min (by C-G formula based on Cr of 0.55). No results for input(s): VANCOTROUGH, VANCOPEAK, VANCORANDOM, GENTTROUGH, GENTPEAK, GENTRANDOM, TOBRATROUGH, TOBRAPEAK, TOBRARND, AMIKACINPEAK, AMIKACINTROU, AMIKACIN in the last 72 hours.   Microbiology: No results found for this or any previous visit (from the past 720 hour(s)).  Medical History: Past Medical History  Diagnosis Date  . Gastric ulcer   . Acute blood loss anemia 03/02/2015     Assessment: [age] yoF with recent history of upper GI bleed.  Patient was hospitalized while on vacation last week and received 4 units of PRBC and had 3 ulcers cauterized.  Today, she presents with Hgb 4.9, heme-positive stools, and CXR in ED revealed pneumonia.  Vancomycin 1g and Zosyn 3.375g x 1 each given in ED.  Zosyn 3.375g IV q8h and vancomycin per pharmacy dosing ordered on admission.  Today, 03/02/2015: - WBC WNL - Afebrile - CrCl~44 ml/min (using SCr 0.8 for age) - blood, urine, sputum cultures ordered  Goal of Therapy:  Vancomycin trough level 15-20 mcg/ml  Doses adjusted per renal function Eradication of infection  Plan:  1.  Vancomycin 500 mg IV q12h. 2.  Continue Zosyn 3.375g IV q8h (4 hour infusion time).  3.  F/u SCr, trough levels as indicated, clinical  course.  Clance Bollunyon, Nalaysia Manganiello 03/02/2015,2:59 PM

## 2015-03-02 NOTE — ED Notes (Signed)
Hospitalist at bedside 

## 2015-03-03 ENCOUNTER — Encounter (HOSPITAL_COMMUNITY): Payer: Self-pay

## 2015-03-03 ENCOUNTER — Ambulatory Visit: Payer: Self-pay | Admitting: Internal Medicine

## 2015-03-03 ENCOUNTER — Inpatient Hospital Stay (HOSPITAL_COMMUNITY): Payer: Medicare Other | Admitting: Anesthesiology

## 2015-03-03 ENCOUNTER — Encounter (HOSPITAL_COMMUNITY)
Admission: EM | Disposition: A | Discharge status: Home Health | Payer: Self-pay | Source: Home / Self Care | Attending: Internal Medicine

## 2015-03-03 ENCOUNTER — Inpatient Hospital Stay (HOSPITAL_COMMUNITY): Payer: Medicare Other

## 2015-03-03 ENCOUNTER — Other Ambulatory Visit: Payer: Self-pay | Admitting: *Deleted

## 2015-03-03 DIAGNOSIS — Z8711 Personal history of peptic ulcer disease: Secondary | ICD-10-CM | POA: Diagnosis present

## 2015-03-03 DIAGNOSIS — Z8719 Personal history of other diseases of the digestive system: Secondary | ICD-10-CM

## 2015-03-03 DIAGNOSIS — D509 Iron deficiency anemia, unspecified: Secondary | ICD-10-CM

## 2015-03-03 HISTORY — PX: ESOPHAGOGASTRODUODENOSCOPY (EGD) WITH PROPOFOL: SHX5813

## 2015-03-03 LAB — COMPREHENSIVE METABOLIC PANEL
ALBUMIN: 2.5 g/dL — AB (ref 3.5–5.0)
ALK PHOS: 84 U/L (ref 38–126)
ALT: 14 U/L (ref 14–54)
ANION GAP: 2 — AB (ref 5–15)
AST: 19 U/L (ref 15–41)
BUN: 16 mg/dL (ref 6–20)
CO2: 28 mmol/L (ref 22–32)
Calcium: 8.3 mg/dL — ABNORMAL LOW (ref 8.9–10.3)
Chloride: 108 mmol/L (ref 101–111)
Creatinine, Ser: 0.68 mg/dL (ref 0.44–1.00)
GFR calc Af Amer: >60 mL/min (ref >60)
GFR calc non Af Amer: >60 mL/min (ref >60)
GLUCOSE: 116 mg/dL — AB (ref 65–99)
POTASSIUM: 3.3 mmol/L — AB (ref 3.5–5.1)
SODIUM: 138 mmol/L (ref 135–145)
Total Bilirubin: 2 mg/dL — ABNORMAL HIGH (ref 0.3–1.2)
Total Protein: 5.2 g/dL — ABNORMAL LOW (ref 6.5–8.1)

## 2015-03-03 LAB — TYPE AND SCREEN
ABO/RH(D): A NEG
Antibody Screen: NEGATIVE
UNIT DIVISION: 0
UNIT DIVISION: 0
Unit division: 0

## 2015-03-03 LAB — LIPID PANEL
CHOL/HDL RATIO: 4.8 ratio
CHOLESTEROL: 134 mg/dL (ref 0–200)
HDL: 28 mg/dL — ABNORMAL LOW (ref >40)
LDL Cholesterol: 76 mg/dL (ref 0–99)
Triglycerides: 149 mg/dL (ref <150)
VLDL: 30 mg/dL (ref 0–40)

## 2015-03-03 LAB — URINE CULTURE: CULTURE: NO GROWTH

## 2015-03-03 LAB — HIV ANTIBODY (ROUTINE TESTING W REFLEX): HIV Screen 4th Generation wRfx: NONREACTIVE

## 2015-03-03 LAB — TSH: TSH: 8.43 u[IU]/mL — AB (ref 0.350–4.500)

## 2015-03-03 LAB — CBC
HCT: 29.6 % — ABNORMAL LOW (ref 36.0–46.0)
HEMOGLOBIN: 10.2 g/dL — AB (ref 12.0–15.0)
MCH: 29.9 pg (ref 26.0–34.0)
MCHC: 34.5 g/dL (ref 30.0–36.0)
MCV: 86.8 fL (ref 78.0–100.0)
PLATELETS: 213 10*3/uL (ref 150–400)
RBC: 3.41 MIL/uL — AB (ref 3.87–5.11)
RDW: 17.5 % — ABNORMAL HIGH (ref 11.5–15.5)
WBC: 9.5 10*3/uL (ref 4.0–10.5)

## 2015-03-03 LAB — TROPONIN I
TROPONIN I: 0.09 ng/mL — AB (ref <0.031)
TROPONIN I: 0.06 ng/mL — AB (ref <0.031)
Troponin I: 0.18 ng/mL — ABNORMAL HIGH (ref <0.031)

## 2015-03-03 LAB — PROTIME-INR
INR: 1.06 (ref 0.00–1.49)
Prothrombin Time: 14 seconds (ref 11.6–15.2)

## 2015-03-03 LAB — MAGNESIUM: Magnesium: 2 mg/dL (ref 1.7–2.4)

## 2015-03-03 LAB — GLUCOSE, CAPILLARY: GLUCOSE-CAPILLARY: 115 mg/dL — AB (ref 65–99)

## 2015-03-03 SURGERY — ESOPHAGOGASTRODUODENOSCOPY (EGD) WITH PROPOFOL
Anesthesia: Monitor Anesthesia Care

## 2015-03-03 MED ORDER — SODIUM CHLORIDE 0.9 % IV SOLN
INTRAVENOUS | Status: AC
  Administered 2015-03-03 – 2015-03-04 (×2): via INTRAVENOUS
  Filled 2015-03-03 (×3): qty 1000

## 2015-03-03 MED ORDER — BUTAMBEN-TETRACAINE-BENZOCAINE 2-2-14 % EX AERO
INHALATION_SPRAY | CUTANEOUS | Status: DC | PRN
  Administered 2015-03-03: 2 via TOPICAL

## 2015-03-03 MED ORDER — SODIUM CHLORIDE 0.9 % IV SOLN
INTRAVENOUS | Status: DC
  Administered 2015-03-04: 05:00:00 via INTRAVENOUS

## 2015-03-03 MED ORDER — PANTOPRAZOLE SODIUM 40 MG IV SOLR: 40.0000 mg | Freq: Two times a day (BID) | INTRAVENOUS | Status: DC

## 2015-03-03 MED ORDER — PROPOFOL 10 MG/ML IV BOLUS
INTRAVENOUS | Status: AC
  Filled 2015-03-03: qty 20

## 2015-03-03 MED ORDER — PROPOFOL 10 MG/ML IV BOLUS
INTRAVENOUS | Status: DC | PRN
  Administered 2015-03-03 (×7): 20 mg via INTRAVENOUS

## 2015-03-03 MED ORDER — SODIUM CHLORIDE 0.9 % IV SOLN
510.0000 mg | Freq: Once | INTRAVENOUS | Status: AC
  Administered 2015-03-03: 510 mg via INTRAVENOUS
  Filled 2015-03-03: qty 17

## 2015-03-03 MED ORDER — LACTATED RINGERS IV SOLN
INTRAVENOUS | Status: DC | PRN
Start: 2015-03-03 — End: 2015-03-03
  Administered 2015-03-03: 12:00:00 via INTRAVENOUS

## 2015-03-03 MED ORDER — PANTOPRAZOLE SODIUM 40 MG PO TBEC
40.0000 mg | DELAYED_RELEASE_TABLET | Freq: Every day | ORAL | Status: DC
  Administered 2015-03-04 – 2015-03-06 (×3): 40 mg via ORAL
  Filled 2015-03-03 (×5): qty 1

## 2015-03-03 SURGICAL SUPPLY — 14 items

## 2015-03-03 NOTE — Progress Notes (Addendum)
[facility] PROGRESS NOTE  Irena Gaydos ZOX:[id number] DOB: 03/29/1931 DOA: 03/02/2015 PCP: Nadean Corwin, MD  Length of stay: 1   Assessment/Plan: Principal Problem:   UGIB (upper gastrointestinal bleed) Active Problems:   Symptomatic anemia   Abnormal EKG   HCAP (healthcare-associated pneumonia)   Acute blood loss anemia   GI bleed   GIB (gastrointestinal bleeding)   Acute encephalopathy   Acute gastric ulcer   Absolute anemia   CVA (cerebral infarction)   Upper GI bleed    #1 upper GI bleed Patient presenting with generalized fatigue noted to have a hemoglobin of 4.7 on admission FOBT was positive. Patient recently admitted hospital in [facility], where patient underwent upper endoscopy that revealed 2 ulcers in the gastric cardia and fundus as well as gastritis noted. Patient was transfused a total of 6 units of packed red blood cells and on discharge from the hospital on 02/24/2015 patient's hemoglobin was 9.3. H pylori antibody was checked which was negative. Patient denies any NSAID use.  Patient presented with hemoglobin of 4.7 , received 3 units of packed red blood cells, hemoglobin 10.2 this morning Discontinue Protonix drip,  EGD negative ,Plan IV iron, Regular diet, Follow-up hemoglobin in 1 week at our office Patient refuses colonoscopy   #2 symptomatic anemia/acute blood loss anemia Secondary to problem #1.  Hemoglobin 4.7 on 11/2.Marland Kitchen Patient's hemoglobin post hospitalization in [facility] was 9.3 on 02/24/2015. Patient had a upper endoscopy done during that hospitalization noted to have gastric ulcers and placed on a PPI. Patient denies any NSAID use. Patient compliant with a posthospitalization medications.  . Received 3 units of packed red blood cells overnight, hemoglobin 10.2 this morning   #3 healthcare associated pneumonia Per chest x-ray. Per family patient was noted to be coughing on discharge from prior hospitalization  however chest x-rays which were done 2 at the outside hospital were negative. Patient noted to have a pneumonia in the posterior left base on admission chest x-ray. Check a sputum Gram stain and culture. Check a urine Legionella antigen. Check a urine pneumococcus antigen. Continue on IV vancomycin and IV Zosyn, day #2. Place on oxygen, Mucinex. Supportive care. Symptomatic treatment.  #4 acute encephalopathy/??, Resolved, questionable infarct on CT scan Patient refused MRI/MRA head, mentally back to baseline, not a candidate for antiplatelet therapy/anticoagulation in the setting of acute blood loss anemia and GI bleeding Pending 2-D echo and carotid Doppler Right: 40-59% internal carotid artery stenosis. Left: 1-39% ICA stenosis.s .  LDL 76, HDL 28.   hemoglobin A1c pending.  Neurologically patient has no focal deficits  #5 abnormal EKG Repeat EKG unchanged, abnormal troponin secondary to demand ischemia in the setting of hemoglobin of 4.7/HCAP. Repeat troponin trending down , Patient currently chest pain-free, no chest pain prior to admission.  Consult cardiology if 2-D echo is abnormal  #6 prophylaxis PPI for GI prophylaxis. SCDs for DVT prophylaxis.     Code Status:      Code Status Orders        Start     Ordered   03/02/15 2042  Full code   Continuous     03/02/15 2041     Family Communication: family updated about patient's clinical progress Disposition Plan:  Transfer to telemetry after EGD   Brief narrative: 81 y.o. female  With no significant prior medical history as per daughter hasn't seen a doctor 55 years presenting to the ED with generalized fatigue and confusion. Patient  [facility] 2 nonbleeding gastric ulcers that were created with a clean ulcer base one measuring 10 mm and another one 12 mm the EGD with gastritis. Patient received a total of 6 units packed red blood cells during the hospitalization and on discharge hemoglobin  went from 4.7 on admission to 9.3 on discharge, patient was hospitalized there from 02/21/2015 - 02/24/2015. Patient was subsequently discharged home on PPI and Carafate and per family required cauterization. Patient had doing been doing better and per family was noted to have a cough prior to discharge however had 2 negative chest x-rays. Patient presented to the ED today secondary to worsening generalized weakness and increasing confusion per family the patient was noted to be disoriented and very weak. Patient denied any fevers, no chills, no nausea, no vomiting, no chest pain, no shortness of breath, no abdominal pain, no diarrhea, no constipation, no melena, no hematemesis, no hematochezia, no dizziness, no lightheadedness. Patient does endorse a nonproductive cough. Patient denies any NSAIDs use. Patient states when he uses Tylenol for pain. Patient was seen in the emergency room CBC done had a hemoglobin of 4.7 MCV of 92.1 otherwise was within normal limits. Compressive metabolic profile had a BUN of 21 calcium of 7.8 glucose of 130 abdomen of 2.2 ALT of 11 total protein of 4.7 otherwise was within normal limits. Lactic acid was 1.5. Troponin was less than 0.03. Urinalysis was nitrite negative leukocytes negative. Chest x-ray was concerning for posterior left basilar pneumonia. CT head showed a suspicion for small early infarct at the gray-white junction of the right posterior frontal-anterior temporal junction. No hemorrhage or mass effect. 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 EKG showed ST depression in leads 2,3, aVF, V4 through V6 and T-wave inversions in leads 1,2 ,V4 through V6. ED physician consulted with gastroenterology. Patient was started on transfusion of 3 units of packed red blood cells. [facility] were called to admit the patient for further evaluation and  management.  Consultants:  Gastroenterology  Procedures:  None  Antibiotics: Anti-infectives    Start     Dose/Rate Route Frequency Ordered Stop   03/03/15 0000  vancomycin (VANCOCIN) 500 mg in sodium chloride 0.9 % 100 mL IVPB     500 mg 100 mL/hr over 60 Minutes Intravenous Every 12 hours 03/02/15 1503     03/02/15 1800  piperacillin-tazobactam (ZOSYN) IVPB 3.375 g     3.375 g 12.5 mL/hr over 240 Minutes Intravenous Every 8 hours 03/02/15 1407     03/02/15 1400  piperacillin-tazobactam (ZOSYN) IVPB 3.375 g  Status:  Discontinued     3.375 g 100 mL/hr over 30 Minutes Intravenous 3 times per day 03/02/15 1306 03/02/15 1406   03/02/15 1115  vancomycin (VANCOCIN) IVPB 1000 mg/200 mL premix     1,000 mg 200 mL/hr over 60 Minutes Intravenous  Once 03/02/15 1113 03/02/15 1324   03/02/15 1115  piperacillin-tazobactam (ZOSYN) IVPB 3.375 g     3.375 g 12.5 mL/hr over 240 Minutes Intravenous  Once 03/02/15 1113 03/02/15 1212         HPI/Subjective: No melena no hematochezia, no nausea vomiting or abdominal pain, daughter is in the room, mentation back to baseline  Objective: Filed Vitals:   03/03/15 0417 03/03/15 0640 03/03/15 0700 03/03/15 0800  BP:  139/71  155/66  Pulse:  73 64 68  Temp: 97.9 F (36.6 C)   98 F (36.7 C)  TempSrc:    Oral  Resp:  18 19 21   Height:      Weight: 52 kg (114 lb 10.2 oz)     SpO2:  97% 96% 97%    Intake/Output Summary (Last 24 hours) at 03/03/15 1020 Last data filed at 03/03/15 0700  Gross per 24 hour  Intake 1529.58 ml  Output    600 ml  Net 929.58 ml    Exam: General: Alert, Well-developed, in NAD Heart: Regular rate and rhythm; no murmurs Pulm;lungs clear Abdomen: Soft, nontender and nondistended. Normal bowel sounds, without guarding, and without rebound.  Extremities: Without edema. Neurologic: Alert and oriented x4; grossly normal neurologically. Psych: Alert and cooperative. Normal mood and affect.  Data  Review   Micro Results Recent Results (from the past 240 hour(s))  Urine culture     Status: None (Preliminary result)   Collection Time: 03/02/15 10:24 AM  Result Value Ref Range Status   Specimen Description URINE, CLEAN CATCH  Final   Special Requests NONE  Final   Culture   Final    NO GROWTH < 24 HOURS Performed at [facility]    Report Status PENDING  Incomplete  MRSA PCR Screening     Status: None   Collection Time: 03/02/15  2:58 PM  Result Value Ref Range Status   MRSA by PCR NEGATIVE NEGATIVE Final    Comment:        The GeneXpert MRSA Assay (FDA approved for NASAL specimens only), is one component of a comprehensive MRSA colonization surveillance program. It is not intended to diagnose MRSA infection nor to guide or monitor treatment for MRSA infections.     Radiology Reports Dg Chest 2 View  03/02/2015  CLINICAL DATA:  Cough and fever.  Altered mental status. EXAM: CHEST  2 VIEW COMPARISON:  None. FINDINGS: There is airspace consolidation in the posterior left base. Lungs elsewhere clear. Heart size and pulmonary vascularity are normal. No adenopathy. There is upper thoracic levoscoliosis. IMPRESSION: Airspace consolidation consistent with pneumonia, posterior left base. Followup PA and lateral chest radiographs recommended in 3-4 weeks following trial of antibiotic therapy to ensure resolution and exclude underlying malignancy. Electronically Signed   By: Bretta Bang III M.D.   On: 03/02/2015 10:20   Ct Head Wo Contrast  03/02/2015  CLINICAL DATA:  Altered mental status EXAM: CT HEAD WITHOUT CONTRAST TECHNIQUE: Contiguous axial images were obtained from the base of the skull through the vertex without intravenous contrast. COMPARISON:  None. FINDINGS: There is age related volume loss. There is no intracranial mass hemorrhage, extra-axial fluid collection, or midline shift. There is a prior focal infarct in the anterior limb of the right internal capsule.  There is slight small vessel disease in the centra semiovale bilaterally. On axial slice 14 series 2, there is a small focus of decreased attenuation at the gray - white compartment junction of the right frontal -temporal lobe. This area may represent a small acute infarct. Gray-white compartments elsewhere appear normal. The bony calvarium appears intact. The mastoid air cells are clear. IMPRESSION: Suspect small early infarct at the gray-white junction of the right posterior frontal -anterior temporal junction. Slight periventricular small vessel disease. 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 No hemorrhage or mass effect. Electronically Signed   By: Bretta Bang III M.D.   On: 03/02/2015 11:04     CBC  Recent Labs Lab 03/02/15 0902 03/03/15 0200  WBC 9.6 9.5  HGB 4.7* 10.2*  HCT 15.1* 29.6*  PLT 291 213  MCV 92.1 86.8  MCH 28.7 29.9  MCHC 31.1 34.5  RDW 21.3* 17.5*    Chemistries   Recent Labs Lab 03/02/15 0902 03/03/15 0200  NA 139 138  K 3.7 3.3*  CL 111 108  CO2 24 28  GLUCOSE 130* 116*  BUN 21* 16  CREATININE 0.55 0.68  CALCIUM 7.8* 8.3*  MG  --  2.0  AST 15 19  ALT 11* 14  ALKPHOS 80 84  BILITOT 0.4 2.0*   ------------------------------------------------------------------------------------------------------------------ estimated creatinine clearance is 44.5 mL/min (by C-G formula based on Cr of 0.68). ------------------------------------------------------------------------------------------------------------------ No results for input(s): HGBA1C in the last 72 hours. ------------------------------------------------------------------------------------------------------------------  Recent Labs  03/03/15 0200  CHOL 134  HDL 28*  LDLCALC 76  TRIG 161  CHOLHDL 4.8   ------------------------------------------------------------------------------------------------------------------  Recent Labs  03/03/15 0200  TSH 8.430*    ------------------------------------------------------------------------------------------------------------------ No results for input(s): VITAMINB12, FOLATE, FERRITIN, TIBC, IRON, RETICCTPCT in the last 72 hours.  Coagulation profile  Recent Labs Lab 03/02/15 0902 03/03/15 0200  INR 1.16 1.06    No results for input(s): DDIMER in the last 72 hours.  Cardiac Enzymes  Recent Labs Lab 03/02/15 0902 03/03/15 0200  TROPONINI <0.03 0.18*   ------------------------------------------------------------------------------------------------------------------ Invalid input(s): POCBNP   CBG:  Recent Labs Lab 03/03/15 0808  GLUCAP 115*       Studies: Dg Chest 2 View  03/02/2015  CLINICAL DATA:  Cough and fever.  Altered mental status. EXAM: CHEST  2 VIEW COMPARISON:  None. FINDINGS: There is airspace consolidation in the posterior left base. Lungs elsewhere clear. Heart size and pulmonary vascularity are normal. No adenopathy. There is upper thoracic levoscoliosis. IMPRESSION: Airspace consolidation consistent with pneumonia, posterior left base. Followup PA and lateral chest radiographs recommended in 3-4 weeks following trial of antibiotic therapy to ensure resolution and exclude underlying malignancy. Electronically Signed   By: Bretta Bang III M.D.   On: 03/02/2015 10:20   Ct Head Wo Contrast  03/02/2015  CLINICAL DATA:  Altered mental status EXAM: CT HEAD WITHOUT CONTRAST TECHNIQUE: Contiguous axial images were obtained from the base of the skull through the vertex without intravenous contrast. COMPARISON:  None. FINDINGS: There is age related volume loss. There is no intracranial mass hemorrhage, extra-axial fluid collection, or midline shift. There is a prior focal infarct in the anterior limb of the right internal capsule. There is slight small vessel disease in the centra semiovale bilaterally. On axial slice 14 series 2, there is a small focus of decreased attenuation  at the gray - white compartment junction of the right frontal -temporal lobe. This area may represent a small acute infarct. Gray-white compartments elsewhere appear normal. The bony calvarium appears intact. The mastoid air cells are clear. IMPRESSION: Suspect small early infarct at the gray-white junction of the right posterior frontal -anterior temporal junction. Slight periventricular small vessel disease. 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 No hemorrhage or mass effect. Electronically Signed   By: Bretta Bang III M.D.   On: 03/02/2015 11:04      No results found for: HGBA1C Lab Results  Component Value Date   LDLCALC 76 03/03/2015   CREATININE 0.68 03/03/2015       Scheduled Meds: . antiseptic oral rinse  7 mL Mouth Rinse q12n4p  . carvedilol  3.125 mg Oral BID WC  . chlorhexidine  15 mL Mouth Rinse BID  . [START ON 03/06/2015] pantoprazole (PROTONIX) IV  40 mg Intravenous Q12H  . piperacillin-tazobactam (ZOSYN)  IV  3.375  g Intravenous Q8H  . sodium chloride  3 mL Intravenous Q12H  . vancomycin  500 mg Intravenous Q12H   Continuous Infusions: . pantoprozole (PROTONIX) infusion 8 mg/hr (03/03/15 0915)  . 0.9 % sodium chloride with kcl 75 mL/hr at 03/03/15 1610    Principal Problem:   UGIB (upper gastrointestinal bleed) Active Problems:   Symptomatic anemia   Abnormal EKG   HCAP (healthcare-associated pneumonia)   Acute blood loss anemia   GI bleed   GIB (gastrointestinal bleeding)   Acute encephalopathy   Acute gastric ulcer   Absolute anemia   CVA (cerebral infarction)   Upper GI bleed    Time spent: 45 minutes   [facility]  [facility] Pager [phone]. If 7PM-7AM, please contact night-coverage at [email or web address], password [email or web address] 03/03/2015, 10:20 AM  LOS: 1 day

## 2015-03-03 NOTE — Care Management Note (Signed)
Case Management Note  Patient Details  Name: Robbie Louislice Stehle MRN: [id number] Date of Birth: 1930/08/07  Subjective/Objective:              Gi bleed and anemia      Action/Plan:Date: March 03, 2015 Chart reviewed for concurrent status and case management needs. Will continue to follow patient for changes and needs: Marcelle Smilinghonda Masiah Woody, RN, BSN, [place]   [phone]   Expected Discharge Date:   (UNKNOWN)               Expected Discharge Plan:  Home/Self Care  In-House Referral:  NA  Discharge planning Services  CM Consult  Post Acute Care Choice:  NA Choice offered to:  NA  DME Arranged:    DME Agency:     HH Arranged:    HH Agency:     Status of Service:  In process, will continue to follow  Medicare Important Message Given:    Date Medicare IM Given:    Medicare IM give by:    Date Additional Medicare IM Given:    Additional Medicare Important Message give by:     If discussed at Long Length of Stay Meetings, dates discussed:    Additional Comments:  Golda AcreDavis, Benjamyn Hestand Lynn, RN 03/03/2015, 10:45 AM

## 2015-03-03 NOTE — Anesthesia Preprocedure Evaluation (Addendum)
Anesthesia Evaluation  Patient identified by MRN, date of birth, ID band Patient awake    Reviewed: Allergy & Precautions, NPO status , Patient's Chart, lab work & pertinent test results  History of Anesthesia Complications Negative for: history of anesthetic complications  Airway Mallampati: I  TM Distance: >3 FB Neck ROM: Full    Dental  (+) Edentulous Upper, Edentulous Lower   Pulmonary neg pulmonary ROS,    breath sounds clear to auscultation       Cardiovascular (-) anginanegative cardio ROS   Rhythm:Regular Rate:Normal     Neuro/Psych negative neurological ROS     GI/Hepatic Neg liver ROS, PUD, GERD  Medicated and Controlled,GI bleed   Endo/Other  negative endocrine ROS  Renal/GU negative Renal ROS     Musculoskeletal   Abdominal   Peds  Hematology  (+) Blood dyscrasia (Hb 10.2), ,   Anesthesia Other Findings   Reproductive/Obstetrics                            Anesthesia Physical Anesthesia Plan  ASA: III  Anesthesia Plan: MAC   Post-op Pain Management:    Induction:   Airway Management Planned: Natural Airway and Nasal Cannula  Additional Equipment:   Intra-op Plan:   Post-operative Plan:   Informed Consent: I have reviewed the patients History and Physical, chart, labs and discussed the procedure including the risks, benefits and alternatives for the proposed anesthesia with the patient or authorized representative who has indicated his/her understanding and acceptance.     Plan Discussed with: CRNA and Surgeon  Anesthesia Plan Comments: (Plan routine monitors, MAC)        Anesthesia Quick Evaluation

## 2015-03-03 NOTE — Progress Notes (Signed)
Discussed upper endoscopy results with patient, daughter and granddaughter after procedure No source of iron deficiency anemia or recurrent anemia as peptic ulcer disease has improved No blood seen in the upper GI tract Colonoscopy recommended though after thorough discussion the patient does not wish to proceed. She understands that she could have a large polyp or even a colon cancer and after this discussion she remains firm that she does not want colonoscopy. Plan IV iron Regular diet Follow-up hemoglobin in 1 week at our office Daily PPI GI available, call with questions

## 2015-03-03 NOTE — Op Note (Signed)
[facility] [address] [place], [id number]   ENDOSCOPY PROCEDURE REPORT  PATIENT: Erica Owen, Erica Owen  MR#: [id number] BIRTHDATE: 09/16/1930 , 84  yrs. old GENDER: female ENDOSCOPIST: Beverley FiedlerJay M Trevelle Mcgurn, MD REFERRED BY:  [facility] PROCEDURE DATE:  03/03/2015 PROCEDURE:  EGD, diagnostic ASA CLASS:     Class III INDICATIONS:  recurrent profound anemia with iron deficiency in the setting of recent UGI bleeding due to gastric ulcers. MEDICATIONS: Monitored anesthesia care and Per Anesthesia TOPICAL ANESTHETIC: Cetacaine Spray  DESCRIPTION OF PROCEDURE: After the risks benefits and alternatives of the procedure were thoroughly explained, informed consent was obtained.  The Pentax Gastroscope [id number] endoscope was introduced through the mouth and advanced to the second portion of the duodenum , Without limitations.  The instrument was slowly withdrawn as the mucosa was fully examined.   ESOPHAGUS: The mucosa of the esophagus appeared normal.   Z line unremarkable  STOMACH: Mild to moderate gastritis (inflammation) was found in the cardia and gastric fundus.   No residual ulcerations were seen. No erosions. The gastric mucosa in this area is minimally friable with contact.  DUODENUM: The duodenal mucosa showed no abnormalities in the bulb and 2nd part of the duodenum.  Retroflexed views revealed no abnormalities.     The scope was then withdrawn from the patient and the procedure completed.  COMPLICATIONS: There were no immediate complications.      ENDOSCOPIC IMPRESSION: 1.   The mucosa of the esophagus appeared normal 2.   Mild to moderate gastritis (inflammation) was found in the cardia and gastric fundus; no residual ulcers; no evidence of bleeding 3.   The duodenal mucosa showed no abnormalities in the bulb and 2nd part of the duodenum  RECOMMENDATIONS: 1.  Monitor hemoglobin, recommend IV iron infusion while here 2.  Once daily PPI sufficient 3.   No source found to explain recurrent anemia or iron deficiency, would consider colonoscopy. Will discuss with patient and family  eSigned:  Beverley FiedlerJay M Kasidi Shanker, MD 03/03/2015 12:52 PM    CC: the patient  PATIENT NAME:  Erica Owen, Erica Owen MR#: [id number]

## 2015-03-03 NOTE — Progress Notes (Signed)
Patient refused to go to MRI this morning because she is claustrophobic. Patient thinks that even with anti-anxiety medications she will be unable to do the MRI; RN will pass this to day RN.

## 2015-03-03 NOTE — Anesthesia Postprocedure Evaluation (Signed)
  Anesthesia Post-op Note  Patient: Erica Owen  Procedure(s) Performed: Procedure(s): ESOPHAGOGASTRODUODENOSCOPY (EGD) WITH PROPOFOL (N/A)  Patient Location: Endoscopy Unit  Anesthesia Type:MAC  Level of Consciousness: awake, alert , oriented and patient cooperative  Airway and Oxygen Therapy: Patient Spontanous Breathing and Patient connected to nasal cannula oxygen  Post-op Pain: none  Post-op Assessment: Post-op Vital signs reviewed, Patient's Cardiovascular Status Stable, Respiratory Function Stable, Patent Airway, No signs of Nausea or vomiting and Pain level controlled              Post-op Vital Signs: Reviewed and stable  Last Vitals:  Filed Vitals:   03/03/15 1250  BP: 73/29  Pulse:   Temp:   Resp:     Complications: No apparent anesthesia complications

## 2015-03-03 NOTE — Progress Notes (Signed)
MEDICATION RELATED CONSULT NOTE - INITIAL   Pharmacy Consult for Iron IV Indication: Anemia  No Known Allergies  Patient Measurements: Height: 5\' 4"  (162.6 cm) Weight: 114 lb 10.2 oz (52 kg) IBW/kg (Calculated) : 54.7   Vital Signs: Temp: 98.4 F (36.9 C) (11/03 1243) Temp Source: Oral (11/03 1243) BP: 97/72 mmHg (11/03 1300) Pulse Rate: 74 (11/03 1300)  Labs:  Recent Labs  03/02/15 0902 03/03/15 0200  WBC 9.6 9.5  HGB 4.7* 10.2*  HCT 15.1* 29.6*  PLT 291 213  CREATININE 0.55 0.68  MG  --  2.0  ALBUMIN 2.2* 2.5*  PROT 4.7* 5.2*  AST 15 19  ALT 11* 14  ALKPHOS 80 84  BILITOT 0.4 2.0*   Estimated Creatinine Clearance: 44.5 mL/min (by C-G formula based on Cr of 0.68).   Medical History: Past Medical History  Diagnosis Date  . Gastric ulcer   . Acute blood loss anemia 03/02/2015    Medications:  Scheduled:  . antiseptic oral rinse  7 mL Mouth Rinse q12n4p  . carvedilol  3.125 mg Oral BID WC  . chlorhexidine  15 mL Mouth Rinse BID  . [START ON 03/04/2015] pantoprazole  40 mg Oral Q0600  . piperacillin-tazobactam (ZOSYN)  IV  3.375 g Intravenous Q8H  . sodium chloride  3 mL Intravenous Q12H  . vancomycin  500 mg Intravenous Q12H   Infusions:  . 0.9 % sodium chloride with kcl 75 mL/hr at 03/03/15 1100    Assessment: [age] yoF admitted 11/2 with GI bleed, low Hgb.  She has history of recent GI bleed, transfusion, and was also stated on broad spectrum antibiotics for pneumonia.  Fecal Occult Blood positive.  GI consult notes no source of iron deficiency anemia or recurrent anemia from upper endoscopy on 11/3, and pt refuses colonoscopy.  Pharmacy is consulted to dose IV iron infusion for goal Hgb 13.  Today, 03/03/2015: Hgb 10.2 Hct 29.6 MCV, MCH, MCHC: WNL No iron studies available.  Goal of Therapy:  Hgb 13 g/dL  Plan:   Feraheme 510mg  IV once.  Pharmacy to sign off at this time; please reconsult if needed.   Lynann Beaverhristine Markiya Keefe PharmD, BCPS Pager  [phone] 03/03/2015 2:15 PM

## 2015-03-03 NOTE — Progress Notes (Signed)
BP 109/59 after 500 cc bolus  Earnest ConroyBrooke M. Clelia CroftShaw, RN

## 2015-03-03 NOTE — Progress Notes (Signed)
OT Cancellation Note  Patient Details Name: Erica Owen MRN: [id number] DOB: Feb 10, 1931   Cancelled Treatment:    Reason Eval/Treat Not Completed: Patient at procedure or test/ unavailable  [name] 03/03/2015, 1:42 PM  [name], OTR/L [phone] 03/03/2015

## 2015-03-03 NOTE — Transfer of Care (Signed)
Immediate Anesthesia Transfer of Care Note  Patient: Erica Owen  Procedure(s) Performed: Procedure(s): ESOPHAGOGASTRODUODENOSCOPY (EGD) WITH PROPOFOL (N/A)  Patient Location: PACU  Anesthesia Type:MAC  Level of Consciousness: sedated  Airway & Oxygen Therapy: Patient Spontanous Breathing and Patient connected to nasal cannula oxygen  Post-op Assessment: Report given to RN and Post -op Vital signs reviewed and stable  Post vital signs: Reviewed and stable  Last Vitals:  Filed Vitals:   03/03/15 1155  BP: 149/66  Pulse: 75  Temp:   Resp: 23    Complications: No apparent anesthesia complications

## 2015-03-03 NOTE — Progress Notes (Signed)
  Echocardiogram 2D Echocardiogram has been performed.  Murray Durrell 03/03/2015, 11:35 AM

## 2015-03-03 NOTE — Progress Notes (Signed)
[facility] Progress Note  Subjective:  Hgb 10.2. No BM. No N/V. No abd pain. Refused MRI earlier this morning.   Objective:  Vital signs in last 24 hours: Temp:  [97.5 F (36.4 C)-99.7 F (37.6 C)] 98 F (36.7 C) (11/03 0800) Pulse Rate:  [64-104] 68 (11/03 0800) Resp:  [16-32] 21 (11/03 0800) BP: (94-181)/(52-95) 155/66 mmHg (11/03 0800) SpO2:  [82 %-100 %] 97 % (11/03 0800) Weight:  [114 lb 10.2 oz (52 kg)-114 lb 13.8 oz (52.1 kg)] 114 lb 10.2 oz (52 kg) (11/03 0417)   General:   Alert,  Well-developed,    in NAD Heart:  Regular rate and rhythm; no murmurs Pulm;lungs clear Abdomen:  Soft, nontender and nondistended. Normal bowel sounds, without guarding, and without rebound.   Extremities:  Without edema. Neurologic: Alert and  oriented x4;  grossly normal neurologically. Psych:  Alert and cooperative. Normal mood and affect.  Intake/Output from previous day: 11/02 0701 - 11/03 0700 In: 1529.6 [I.V.:369.6; Blood:960; IV Piggyback:200] Out: 600 [Urine:600] Intake/Output this shift:    Lab Results:  Recent Labs  03/02/15 0902 03/03/15 0200  WBC 9.6 9.5  HGB 4.7* 10.2*  HCT 15.1* 29.6*  PLT 291 213   BMET  Recent Labs  03/02/15 0902 03/03/15 0200  NA 139 138  K 3.7 3.3*  CL 111 108  CO2 24 28  GLUCOSE 130* 116*  BUN 21* 16  CREATININE 0.55 0.68  CALCIUM 7.8* 8.3*   LFT  Recent Labs  03/03/15 0200  PROT 5.2*  ALBUMIN 2.5*  AST 19  ALT 14  ALKPHOS 84  BILITOT 2.0*   PT/INR  Recent Labs  03/02/15 0902 03/03/15 0200  LABPROT 14.9 14.0  INR 1.16 1.06    Dg Chest 2 View  03/02/2015  CLINICAL DATA:  Cough and fever.  Altered mental status. EXAM: CHEST  2 VIEW COMPARISON:  None. FINDINGS: There is airspace consolidation in the posterior left base. Lungs elsewhere clear. Heart size and pulmonary vascularity are normal. No adenopathy. There is upper thoracic levoscoliosis. IMPRESSION: Airspace consolidation consistent with  pneumonia, posterior left base. Followup PA and lateral chest radiographs recommended in 3-4 weeks following trial of antibiotic therapy to ensure resolution and exclude underlying malignancy. Electronically Signed   By: Bretta Bang III M.D.   On: 03/02/2015 10:20   Ct Head Wo Contrast  03/02/2015  CLINICAL DATA:  Altered mental status EXAM: CT HEAD WITHOUT CONTRAST TECHNIQUE: Contiguous axial images were obtained from the base of the skull through the vertex without intravenous contrast. COMPARISON:  None. FINDINGS: There is age related volume loss. There is no intracranial mass hemorrhage, extra-axial fluid collection, or midline shift. There is a prior focal infarct in the anterior limb of the right internal capsule. There is slight small vessel disease in the centra semiovale bilaterally. On axial slice 14 series 2, there is a small focus of decreased attenuation at the gray - white compartment junction of the right frontal -temporal lobe. This area may represent a small acute infarct. Gray-white compartments elsewhere appear normal. The bony calvarium appears intact. The mastoid air cells are clear. IMPRESSION: Suspect small early infarct at the gray-white junction of the right posterior frontal -anterior temporal junction. Slight periventricular small vessel disease. 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 No hemorrhage or mass effect. Electronically Signed   By: Bretta Bang III M.D.   On: 03/02/2015 11:04    ASSESSMENT/PLAN:   81 yo femalehospitalized with hematemesis  and melena [place] ago in [place] on vacation. Upper endoscopy was performed which revealed 2 ulcers in the gastric cardia and fundus one of which had a red spot and was treated.Admitted yesterday with weakness, Hgb 4.7. Rec 3 units prbcs.m For EGD today. Pneumonia--on vanc and zosyn. Encephalopathy--? CVA per CT, pt refused MRI this morning.   LOS: 1 day   Terryann Verbeek, Tollie PizzaLori P PA-C  03/03/2015, Pager [phone] Mon-Fri 8a-5p [phone] after 5p, weekends, holidays

## 2015-03-03 NOTE — Evaluation (Signed)
Physical Therapy Evaluation Patient Details Name: Erica Owen MRN: [id number] DOB: [birth year] Today's Date: 03/03/2015   History of Present Illness  81 yo female admitted with UGIB, orthostatic hypotension, weakness., CT + possible infarct R frontal anterior temporal junction.   Clinical Impression  On eval, pt required Min assist for mobility-walked ~75 feet with RW. Pt tolerated session fairly well. C/o feeling weak while ambulating. Will continue to assess mobility and progress activity.     Follow Up Recommendations Home health PT;Supervision/Assistance - 24 hour    Equipment Recommendations   (to be determined further)    Recommendations for Other Services       Precautions / Restrictions Precautions Precautions: Fall Restrictions Weight Bearing Restrictions: No      Mobility  Bed Mobility Overal bed mobility: Needs Assistance Bed Mobility: Supine to Sit     Supine to sit: Supervision     General bed mobility comments: for safety. increased time  Transfers Overall transfer level: Needs assistance Equipment used: Rolling walker (2 wheeled) Transfers: Sit to/from Stand Sit to Stand: Min assist         General transfer comment: Assist to rise, stabilize, control descent. VCs safety, technique,hand placement  Ambulation/Gait Ambulation/Gait assistance: Min assist Ambulation Distance (Feet): 75 Feet Assistive device: Rolling walker (2 wheeled) Gait Pattern/deviations: Step-through pattern     General Gait Details: Assist to stabilize and maneuver safely with walker. Pt c/o feeling weak so deferred further ambulation. Pt is unsteady.   Stairs            Wheelchair Mobility    Modified Rankin (Stroke Patients Only)       Balance Overall balance assessment: Needs assistance         Standing balance support: During functional activity Standing balance-Leahy Scale: Fair                               Pertinent Vitals/Pain Pain  Assessment: Faces Faces Pain Scale: Hurts little more Pain Location: bil LEs    Home Living Family/patient expects to be discharged to:: Private residence Living Arrangements: Children Available Help at Discharge: Family Type of Home: House Home Access: Stairs to enter Entrance Stairs-Rails: Right (at back stairs) Entrance Stairs-Number of Steps: 3-5 Home Layout: Able to live on main level with bedroom/bathroom;Two level Home Equipment: None      Prior Function Level of Independence: Independent               Hand Dominance        Extremity/Trunk Assessment   Upper Extremity Assessment: Defer to OT evaluation           Lower Extremity Assessment: Generalized weakness      Cervical / Trunk Assessment: Kyphotic  Communication   Communication: No difficulties  Cognition Arousal/Alertness: Awake/alert Behavior During Therapy: WFL for tasks assessed/performed Overall Cognitive Status: Within Functional Limits for tasks assessed                      General Comments      Exercises        Assessment/Plan    PT Assessment Patient needs continued PT services  PT Diagnosis Difficulty walking;Generalized weakness;Acute pain   PT Problem List Decreased strength;Decreased activity tolerance;Decreased balance;Decreased mobility;Decreased knowledge of use of DME;Pain  PT Treatment Interventions DME instruction;Gait training;Functional mobility training;Therapeutic activities;Patient/family education;Balance training;Therapeutic exercise   PT Goals (Current goals can be found in the Care Plan  section) Acute Rehab PT Goals Patient Stated Goal: to feel better PT Goal Formulation: With patient/family Time For Goal Achievement: 03/17/15 Potential to Achieve Goals: Good    Frequency Min 3X/week   Barriers to discharge        Co-evaluation               End of Session Equipment Utilized During Treatment: Gait belt Activity Tolerance: Patient  tolerated treatment well Patient left: in chair;with call bell/phone within reach;with family/visitor present           Time: 1555-1611 PT Time Calculation (min) (ACUTE ONLY): 16 min   Charges:   PT Evaluation $Initial PT Evaluation Tier I: 1 Procedure     PT G Codes:        Rebeca AlertJannie Jerel Sardina, MPT Pager: [phone]

## 2015-03-04 ENCOUNTER — Encounter (HOSPITAL_COMMUNITY): Payer: Self-pay | Admitting: Internal Medicine

## 2015-03-04 LAB — CBC
HCT: 27.4 % — ABNORMAL LOW (ref 36.0–46.0)
Hemoglobin: 9 g/dL — ABNORMAL LOW (ref 12.0–15.0)
MCH: 29.6 pg (ref 26.0–34.0)
MCHC: 32.8 g/dL (ref 30.0–36.0)
MCV: 90.1 fL (ref 78.0–100.0)
PLATELETS: 233 10*3/uL (ref 150–400)
RBC: 3.04 MIL/uL — ABNORMAL LOW (ref 3.87–5.11)
RDW: 18.8 % — AB (ref 11.5–15.5)
WBC: 8.1 10*3/uL (ref 4.0–10.5)

## 2015-03-04 LAB — HEMOGLOBIN A1C
HEMOGLOBIN A1C: 5.2 % (ref 4.8–5.6)
Mean Plasma Glucose: 103 mg/dL

## 2015-03-04 LAB — GLUCOSE, CAPILLARY: Glucose-Capillary: 92 mg/dL (ref 65–99)

## 2015-03-04 MED ORDER — SODIUM CHLORIDE 0.9 % IV SOLN
510.0000 mg | Freq: Once | INTRAVENOUS | Status: AC
  Administered 2015-03-05: 510 mg via INTRAVENOUS
  Filled 2015-03-04: qty 17

## 2015-03-04 NOTE — Progress Notes (Signed)
Physical Therapy Treatment Patient Details Name: Erica Owen MRN: [id number] DOB: 12-05-1930 Today's Date: 03/04/2015    History of Present Illness 81 yo female admitted with UGIB, orthostatic hypotension, weakness., CT + possible infarct R frontal anterior temporal junction.     PT Comments    Progressing with mobility. Pt is unsteady without external support. Recommend HHPT and RW use at discharge. Pt needs continued therapy for strength and balance training. Plan is for pt to d/c to granddaughter's home.   Follow Up Recommendations  Home health PT;Supervision for OOB/mobility     Equipment Recommendations  Rolling walker with 5" wheels    Recommendations for Other Services       Precautions / Restrictions Precautions Precautions: Fall Restrictions Weight Bearing Restrictions: No    Mobility  Bed Mobility Overal bed mobility: Needs Assistance Bed Mobility: Supine to Sit     Supine to sit: Modified independent (Device/Increase time)        Transfers Overall transfer level: Needs assistance   Transfers: Sit to/from Stand Sit to Stand: Min assist         General transfer comment: Assist to steady. Pt unable to stand statically at EOB without 1 hand support.   Ambulation/Gait Ambulation/Gait assistance: Min guard;Min assist Ambulation Distance (Feet): 450 Feet Assistive device: None;Rolling walker (2 wheeled) Gait Pattern/deviations: Decreased stride length     General Gait Details: 15 feet without external support-pt very unsteady and unable to walk safely without holding on to me. Switched over to RW-Min guard assist. Pt tolerated distance well   Stairs            Wheelchair Mobility    Modified Rankin (Stroke Patients Only)       Balance                                    Cognition Arousal/Alertness: Awake/alert Behavior During Therapy: WFL for tasks assessed/performed Overall Cognitive Status: Within Functional Limits for  tasks assessed                      Exercises General Exercises - Lower Extremity Hip ABduction/ADduction: AROM;Both;10 reps;Standing Hip Flexion/Marching: AROM;Both;10 reps;Standing Heel Raises: AROM;Both;10 reps;Standing    General Comments        Pertinent Vitals/Pain Pain Assessment: No/denies pain    Home Living                      Prior Function            PT Goals (current goals can now be found in the care plan section) Progress towards PT goals: Progressing toward goals    Frequency  Min 3X/week    PT Plan Current plan remains appropriate    Co-evaluation             End of Session Equipment Utilized During Treatment: Gait belt Activity Tolerance: Patient tolerated treatment well Patient left: in chair;with call bell/phone within reach;with chair alarm set     Time: 1116-1130 PT Time Calculation (min) (ACUTE ONLY): 14 min  Charges:  $Gait Training: 8-22 mins                    G Codes:      Rebeca AlertJannie Sahas Sluka, MPT Pager: [phone]

## 2015-03-04 NOTE — Progress Notes (Signed)
[facility] PROGRESS NOTE  Lien Lyman ION:[id number] DOB: 1930-11-13 DOA: 03/02/2015 PCP: Nadean Corwin, MD  Assessment/Plan: 81 y/o female with PMH of Recent hospitalized at [facility]  And Diagnosed of PUD with GI bleeding (Tfsed 6 units discharged on 10/27) is readmitted with severe anemia, found to have Hg 4.7 on admission -admitted with Anemia, HCAP   1. GI bleeding. H/o PUD. Patient underwent repeat EGD which showed Mild to moderate gastritis (inflammation) was found in the cardia and gastric fundus; no residual ulcers; no evidence of bleeding. The duodenal mucosa showed no abnormalities in the bulb and 2nd part of the duodenum -No s/s of active bleeding. patient is on PPI. She refused colonoscopy. GI recommended to f/u in 1 week   2. Acute blood loss anemia. Recent transfusion 6 units OSH -Patient received 3 units PRBC TF at [place]. Repeat Hg 4.7->10.2->9.0. We will repeat Hg in AM. Currently, no s/s of active bleeding  3. Healthcare associated pneumonia. Patient noted to have a pneumonia in the posterior left base on admission chest x-ray.  -Patient is improving on  IV vancomycin and IV Zosyn, day #3. Cont monitor  4. Acute encephalopathy, Resolved, questionable infarct on CT scan. Patient refused MRI/MRA head, mentally back to baseline, not a candidate for antiplatelet therapy/anticoagulation in the setting of acute blood loss anemia and GI bleeding -Echo; LVEF 55-60%. Unremarkable. Carotid Doppler Right: 40-59% internal carotid artery stenosis. Left: 1-39% ICA stenosis. LDL 76, HDL 28. hemoglobin A1c-5.2. Neurologically patient has no focal deficits 5. Abnormal EKG. Repeat EKG unchanged, abnormal troponin secondary to demand ischemia in the setting of hemoglobin of 4.7/HCAP. Repeat troponin trending down -Patient has no acute chest pains. Echo: LVEF -55-60%. Recommended to f/u with cardiology, outpatient stress test evaluation.   Disposition: 24-48 hers. repeat Hg  in AM  Code Status: Full Family Communication: d/w patient, her daughter  (indicate person spoken with, relationship, and if by phone, the number) Disposition Plan: home 24-48 hrs    Consultants:  GI   Procedures:  EGD  Antibiotics:  vanc 11/2>>  Zosyn 11/2>>>    (indicate start date, and stop date if known)  HPI/Subjective: Alert. No distress   Objective: Filed Vitals:   03/04/15 0427  BP: 129/50  Pulse:   Temp: 99.1 F (37.3 C)  Resp: 20    Intake/Output Summary (Last 24 hours) at 03/04/15 0828 Last data filed at 03/04/15 0600  Gross per 24 hour  Intake 2284.5 ml  Output    200 ml  Net 2084.5 ml   Filed Weights   03/02/15 1400 03/03/15 0417 03/04/15 0605  Weight: 52.1 kg (114 lb 13.8 oz) 52 kg (114 lb 10.2 oz) 52.3 kg (115 lb 4.8 oz)    Exam:   General:  Alert, no distress   Cardiovascular: s1,s2 rrr  Respiratory: no wheezing   Abdomen: soft, nt,nd   Musculoskeletal: no leg edema    Data Reviewed: Basic Metabolic Panel:  Recent Labs Lab 03/02/15 0902 03/03/15 0200  NA 139 138  K 3.7 3.3*  CL 111 108  CO2 24 28  GLUCOSE 130* 116*  BUN 21* 16  CREATININE 0.55 0.68  CALCIUM 7.8* 8.3*  MG  --  2.0   Liver Function Tests:  Recent Labs Lab 03/02/15 0902 03/03/15 0200  AST 15 19  ALT 11* 14  ALKPHOS 80 84  BILITOT 0.4 2.0*  PROT 4.7* 5.2*  ALBUMIN 2.2* 2.5*   No results for input(s): LIPASE, AMYLASE in the last 168 hours.  No results for input(s): AMMONIA in the last 168 hours. CBC:  Recent Labs Lab 03/02/15 0902 03/03/15 0200 03/04/15 0515  WBC 9.6 9.5 8.1  HGB 4.7* 10.2* 9.0*  HCT 15.1* 29.6* 27.4*  MCV 92.1 86.8 90.1  PLT 291 213 233   Cardiac Enzymes:  Recent Labs Lab 03/02/15 0902 03/03/15 0200 03/03/15 1040 03/03/15 1555  TROPONINI <0.03 0.18* 0.09* 0.06*   BNP (last 3 results) No results for input(s): BNP in the last 8760 hours.  ProBNP (last 3 results) No results for input(s): PROBNP in the last  8760 hours.  CBG:  Recent Labs Lab 03/03/15 0808 03/04/15 0747  GLUCAP 115* 92    Recent Results (from the past 240 hour(s))  Blood culture (routine x 2)     Status: None (Preliminary result)   Collection Time: 03/02/15  9:39 AM  Result Value Ref Range Status   Specimen Description BLOOD RIGHT FOREARM  Final   Special Requests BOTTLES DRAWN AEROBIC AND ANAEROBIC  Final   Culture   Final    NO GROWTH 1 DAY Performed at [facility]    Report Status PENDING  Incomplete  Blood culture (routine x 2)     Status: None (Preliminary result)   Collection Time: 03/02/15  9:45 AM  Result Value Ref Range Status   Specimen Description BLOOD RIGHT ANTECUBITAL  Final   Special Requests BOTTLES DRAWN AEROBIC AND ANAEROBIC  Final   Culture   Final    NO GROWTH 1 DAY Performed at [facility]    Report Status PENDING  Incomplete  Urine culture     Status: None   Collection Time: 03/02/15 10:24 AM  Result Value Ref Range Status   Specimen Description URINE, CLEAN CATCH  Final   Special Requests NONE  Final   Culture   Final    NO GROWTH 1 DAY Performed at [facility]    Report Status 03/03/2015 FINAL  Final  MRSA PCR Screening     Status: None   Collection Time: 03/02/15  2:58 PM  Result Value Ref Range Status   MRSA by PCR NEGATIVE NEGATIVE Final    Comment:        The GeneXpert MRSA Assay (FDA approved for NASAL specimens only), is one component of a comprehensive MRSA colonization surveillance program. It is not intended to diagnose MRSA infection nor to guide or monitor treatment for MRSA infections.      Studies: Dg Chest 2 View  03/02/2015  CLINICAL DATA:  Cough and fever.  Altered mental status. EXAM: CHEST  2 VIEW COMPARISON:  None. FINDINGS: There is airspace consolidation in the posterior left base. Lungs elsewhere clear. Heart size and pulmonary vascularity are normal. No adenopathy. There is upper thoracic levoscoliosis. IMPRESSION:  Airspace consolidation consistent with pneumonia, posterior left base. Followup PA and lateral chest radiographs recommended in 3-4 weeks following trial of antibiotic therapy to ensure resolution and exclude underlying malignancy. Electronically Signed   By: Bretta Bang III M.D.   On: 03/02/2015 10:20   Ct Head Wo Contrast  03/02/2015  CLINICAL DATA:  Altered mental status EXAM: CT HEAD WITHOUT CONTRAST TECHNIQUE: Contiguous axial images were obtained from the base of the skull through the vertex without intravenous contrast. COMPARISON:  None. FINDINGS: There is age related volume loss. There is no intracranial mass hemorrhage, extra-axial fluid collection, or midline shift. There is a prior focal infarct in the anterior limb of the right internal capsule. There  is slight small vessel disease in the centra semiovale bilaterally. On axial slice 14 series 2, there is a small focus of decreased attenuation at the gray - white compartment junction of the right frontal -temporal lobe. This area may represent a small acute infarct. Gray-white compartments elsewhere appear normal. The bony calvarium appears intact. The mastoid air cells are clear. IMPRESSION: Suspect small early infarct at the gray-white junction of the right posterior frontal -anterior temporal junction. Slight periventricular small vessel disease. 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 No hemorrhage or mass effect. Electronically Signed   By: Bretta BangWilliam  Woodruff III M.D.   On: 03/02/2015 11:04    Scheduled Meds: . antiseptic oral rinse  7 mL Mouth Rinse q12n4p  . carvedilol  3.125 mg Oral BID WC  . chlorhexidine  15 mL Mouth Rinse BID  . pantoprazole  40 mg Oral Q0600  . piperacillin-tazobactam (ZOSYN)  IV  3.375 g Intravenous Q8H  . sodium chloride  3 mL Intravenous Q12H  . vancomycin  500 mg Intravenous Q12H   Continuous Infusions: . sodium chloride 20 mL/hr at 03/04/15 0430  . 0.9 % sodium chloride with kcl 75  mL/hr at 03/04/15 0430    Principal Problem:   UGIB (upper gastrointestinal bleed) Active Problems:   Symptomatic anemia   Abnormal EKG   HCAP (healthcare-associated pneumonia)   Acute blood loss anemia   GI bleed   GIB (gastrointestinal bleeding)   Acute encephalopathy   Acute gastric ulcer   Absolute anemia   CVA (cerebral infarction)   Upper GI bleed   IDA (iron deficiency anemia)   History of gastric ulcer    Time spent: >35 minutes     Esperanza SheetsBURIEV, Sharmayne Jablon N  [facility] Pager [phone]. If 7PM-7AM, please contact night-coverage at [email or web address], password [email or web address] 03/04/2015, 8:28 AM  LOS: 2 days

## 2015-03-04 NOTE — Evaluation (Signed)
Occupational Therapy Evaluation Patient Details Name: Erica Owen MRN: [id number] DOB: [birth year] Today's Date: 03/04/2015    History of Present Illness 81 yo female admitted with UGIB, orthostatic hypotension, weakness., CT + possible infarct R frontal anterior temporal junction.    Clinical Impression   Pt was admitted for the above.  During evaluation, posterior LOB was main limiting factor. Will follow in acute and [facility] promote pt's regaining her independent. She was independent prior to admission and currently needs min A due to balance. Goals in acute are for min guard to supervision,.    Follow Up Recommendations  Home health OT;Supervision/Assistance - 24 hour    Equipment Recommendations  3 in 1 bedside comode ; granddaughter considering separate shower seat--she will look into this.  Educated that 3:1 can double as a Information systems managershower seat also   Recommendations for Other Services       Precautions / Restrictions Precautions Precautions: Fall Restrictions Weight Bearing Restrictions: No      Mobility Bed Mobility Overal bed mobility: Needs Assistance Bed Mobility: Supine to Sit     Supine to sit: Modified independent (Device/Increase time) Sit to supine: Modified independent (Device/Increase time)      Transfers Overall transfer level: Needs assistance Equipment used: Rolling walker (2 wheeled) Transfers: Sit to/from Stand Sit to Stand: Min assist         General transfer comment: tends to lose balance posteriorly.  Assistance to steady    Balance           Standing balance support: Bilateral upper extremity supported Standing balance-Leahy Scale: Poor (when first standing up)                              ADL Overall ADL's : Needs assistance/impaired     Grooming: Wash/dry hands;Min guard;Standing   Upper Body Bathing: Set up;Sitting   Lower Body Bathing: Minimal assistance;Sit to/from stand   Upper  Body Dressing : Set up;Sitting   Lower Body Dressing: Minimal assistance;Sit to/from stand   Toilet Transfer: Minimal assistance;Ambulation;Comfort height toilet;Grab bars   Toileting- Clothing Manipulation and Hygiene: Supervision/safety;Sitting/lateral lean         General ADL Comments: pt has a tendency to lose balance posteriorly.  Talked to granddaughter about guarding her. Recommend shower seat or 3:1 in shower.  Pt would benefit from 3:1 over commode to make getting up easier--got up from comfort height with min A, but increased effort.       Vision     Perception     Praxis      Pertinent Vitals/Pain Pain Assessment: No/denies pain     Hand Dominance     Extremity/Trunk Assessment Upper Extremity Assessment Upper Extremity Assessment: Overall WFL for tasks assessed           Communication Communication Communication: No difficulties   Cognition Arousal/Alertness: Awake/alert Behavior During Therapy: WFL for tasks assessed/performed Overall Cognitive Status: Within Functional Limits for tasks assessed                     General Comments       Exercises       Shoulder Instructions      Home Living Family/patient expects to be discharged to:: Private residence Living Arrangements: Children Available Help at Discharge: Family               Bathroom Shower/Tub: Arts development officerWalk-in shower   Bathroom  Toilet: Standard         Additional Comments: pt will stay at granddaughter's; she will also have daughter assist her      Prior Functioning/Environment Level of Independence: Independent             OT Diagnosis: Generalized weakness   OT Problem List: Decreased strength;Decreased activity tolerance;Impaired balance (sitting and/or standing);Decreased knowledge of use of DME or AE   OT Treatment/Interventions: Self-care/ADL training;DME and/or AE instruction;Patient/family education;Balance training    OT Goals(Current goals can be  found in the care plan section) Acute Rehab OT Goals Patient Stated Goal: to feel better OT Goal Formulation: With patient Time For Goal Achievement: Mar 15, 2015 Potential to Achieve Goals: Good ADL Goals Pt Will Transfer to Toilet: with supervision;ambulating;bedside commode Pt Will Perform Toileting - Clothing Manipulation and hygiene: with supervision;sit to/from stand Pt Will Perform Tub/Shower Transfer: Shower transfer;with min guard assist;ambulating;shower seat Additional ADL Goal #1: pt will complete LB adls with supervision/set up sit to stand  OT Frequency: Min 2X/week   Barriers to D/C:            Co-evaluation              End of Session    Activity Tolerance: Patient tolerated treatment well Patient left: in bed;with call bell/phone within reach;with bed alarm set;with family/visitor present   Time: 5409-8119 OT Time Calculation (min): 19 min Charges:  OT General Charges $OT Visit: 1 Procedure OT Evaluation $Initial OT Evaluation Tier I: 1 Procedure G-Codes:    Deontez Klinke 15-Mar-2015, 12:28 PM  Marica Otter, OTR/L [phone] 2015/03/15

## 2015-03-04 NOTE — Evaluation (Signed)
SLP Cancellation Note  Patient Details Name: Erica Owen MRN: [id number] DOB: [birth year]   Cancelled treatment:       Reason Eval/Treat Not Completed: SLP screened, no needs identified, will sign off   Pt sitting up in bed doing a crossword puzzle - daughter Erica Owen and pt report no SLP needs.  Erica Burnetamara [name], MS [facility] SLP [phone]   Chales AbrahamsKimball, Erica Owen Ann 03/04/2015, 3:01 PM

## 2015-03-05 ENCOUNTER — Inpatient Hospital Stay (HOSPITAL_COMMUNITY): Payer: Medicare Other

## 2015-03-05 DIAGNOSIS — G9340 Encephalopathy, unspecified: Secondary | ICD-10-CM

## 2015-03-05 DIAGNOSIS — R7989 Other specified abnormal findings of blood chemistry: Secondary | ICD-10-CM | POA: Diagnosis present

## 2015-03-05 DIAGNOSIS — R9431 Abnormal electrocardiogram [ECG] [EKG]: Secondary | ICD-10-CM

## 2015-03-05 DIAGNOSIS — D649 Anemia, unspecified: Secondary | ICD-10-CM

## 2015-03-05 DIAGNOSIS — I248 Other forms of acute ischemic heart disease: Secondary | ICD-10-CM

## 2015-03-05 DIAGNOSIS — R946 Abnormal results of thyroid function studies: Secondary | ICD-10-CM

## 2015-03-05 DIAGNOSIS — I2489 Other forms of acute ischemic heart disease: Secondary | ICD-10-CM

## 2015-03-05 HISTORY — DX: Other forms of acute ischemic heart disease: I24.8

## 2015-03-05 HISTORY — DX: Other forms of acute ischemic heart disease: I24.89

## 2015-03-05 LAB — CBC
HEMATOCRIT: 26.9 % — AB (ref 36.0–46.0)
Hemoglobin: 8.7 g/dL — ABNORMAL LOW (ref 12.0–15.0)
MCH: 29.1 pg (ref 26.0–34.0)
MCHC: 32.3 g/dL (ref 30.0–36.0)
MCV: 90 fL (ref 78.0–100.0)
Platelets: 219 10*3/uL (ref 150–400)
RBC: 2.99 MIL/uL — AB (ref 3.87–5.11)
RDW: 19 % — AB (ref 11.5–15.5)
WBC: 7.5 10*3/uL (ref 4.0–10.5)

## 2015-03-05 LAB — BASIC METABOLIC PANEL
ANION GAP: 4 — AB (ref 5–15)
BUN: 9 mg/dL (ref 6–20)
CALCIUM: 8.5 mg/dL — AB (ref 8.9–10.3)
CO2: 26 mmol/L (ref 22–32)
Chloride: 106 mmol/L (ref 101–111)
Creatinine, Ser: 0.76 mg/dL (ref 0.44–1.00)
Glucose, Bld: 98 mg/dL (ref 65–99)
POTASSIUM: 3.6 mmol/L (ref 3.5–5.1)
Sodium: 136 mmol/L (ref 135–145)

## 2015-03-05 LAB — GLUCOSE, CAPILLARY: Glucose-Capillary: 89 mg/dL (ref 65–99)

## 2015-03-05 NOTE — Care Management Important Message (Signed)
Important Message  Patient Details  Name: Erica Owen MRN: [id number] Date of Birth: 09-Feb-1931   Medicare Important Message Given:  Yes-second notification given    Elliot CousinShavis, Emigdio Wildeman Ellen, RN 03/05/2015, 7:02 PM

## 2015-03-05 NOTE — Progress Notes (Signed)
ANTIBIOTIC CONSULT NOTE - follow-up  Pharmacy Consult for Vancomycin, Antibiotic renal dose adjustment Indication: pneumonia  No Known Allergies  Patient Measurements: Height: 5\' 4"  (162.6 cm) Weight: 116 lb 3.2 oz (52.708 kg) IBW/kg (Calculated) : 54.7 Adjusted Body Weight:   Vital Signs: Temp: 99.1 F (37.3 C) (11/05 0601) Temp Source: Oral (11/05 0601) BP: 138/69 mmHg (11/05 0601) Pulse Rate: 72 (11/05 0601) Intake/Output from previous day: 11/04 0701 - 11/05 0700 In: 950 [P.O.:600; IV Piggyback:350] Out: 825 [Urine:825] Intake/Output from this shift: Total I/O In: 120 [P.O.:120] Out: 1200 [Urine:1200]  Labs:  Recent Labs  03/03/15 0200 03/04/15 0515 03/05/15 0510  WBC 9.5 8.1 7.5  HGB 10.2* 9.0* 8.7*  PLT 213 233 219  CREATININE 0.68  --  0.76   Estimated Creatinine Clearance: 45.1 mL/min (by C-G formula based on Cr of 0.76). No results for input(s): VANCOTROUGH, VANCOPEAK, VANCORANDOM, GENTTROUGH, GENTPEAK, GENTRANDOM, TOBRATROUGH, TOBRAPEAK, TOBRARND, AMIKACINPEAK, AMIKACINTROU, AMIKACIN in the last 72 hours.   Microbiology: Recent Results (from the past 720 hour(s))  Blood culture (routine x 2)     Status: None (Preliminary result)   Collection Time: 03/02/15  9:39 AM  Result Value Ref Range Status   Specimen Description BLOOD RIGHT FOREARM  Final   Special Requests BOTTLES DRAWN AEROBIC AND ANAEROBIC 5ML  Final   Culture   Final    NO GROWTH 3 DAYS [facility]    Report Status PENDING  Incomplete  Blood culture (routine x 2)     Status: None (Preliminary result)   Collection Time: 03/02/15  9:45 AM  Result Value Ref Range Status   Specimen Description BLOOD RIGHT ANTECUBITAL  Final   Special Requests BOTTLES DRAWN AEROBIC AND ANAEROBIC 5ML  Final   Culture   Final    NO GROWTH 3 DAYS [facility]    Report Status PENDING  Incomplete  Urine culture     Status: None   Collection Time: 03/02/15 10:24 AM   Result Value Ref Range Status   Specimen Description URINE, CLEAN CATCH  Final   Special Requests NONE  Final   Culture   Final    NO GROWTH 1 DAY [facility]    Report Status 03/03/2015 FINAL  Final  MRSA PCR Screening     Status: None   Collection Time: 03/02/15  2:58 PM  Result Value Ref Range Status   MRSA by PCR NEGATIVE NEGATIVE Final    Comment:        The GeneXpert MRSA Assay (FDA approved for NASAL specimens only), is one component of a comprehensive MRSA colonization surveillance program. It is not intended to diagnose MRSA infection nor to guide or monitor treatment for MRSA infections.     Medical History: Past Medical History  Diagnosis Date  . Gastric ulcer   . Acute blood loss anemia 03/02/2015     Assessment: [age] yoF with recent history of upper GI bleed.  Patient was hospitalized while on vacation last week and received 4 units of PRBC and had 3 ulcers cauterized.  Today, she presents with Hgb 4.9, heme-positive stools, and CXR in ED revealed pneumonia.  Vancomycin 1g and Zosyn 3.375g x 1 each given in ED.  Zosyn 3.375g IV q8h and vancomycin per pharmacy dosing ordered on admission.  11/2 >> Vanc >> 11/2 >> Zosyn >>  11/2 blood x2: ngtd 11/2 urine: NG 11/2: MRSA PCR: negative Sputum: not collected  Today, 03/05/2015: - WBC  WNL - Afebrile - Renal: SCr WNL - ? Slight trend up, UOP looks to be adequate  Goal of Therapy:  Vancomycin trough level 15-20 mcg/ml  Doses adjusted per renal function Eradication of infection  Plan:  Day #3 antibiotics 1.  Continue Vancomycin 500 mg IV q12h.  - Vancomycin trough reportedly ordered by pharmacist for today but no order in [facility].  Re-order for 11/6 if vancomycin to continue.  2.  Continue Zosyn 3.375g IV q8h (4 hour infusion time).  3.  De-escalate as appropriate.   Juliette Alcide, PharmD, BCPS.   Pager: [phone] 03/05/2015 12:03 PM

## 2015-03-05 NOTE — Progress Notes (Signed)
Progress Note   Erica Owen MVH:[id number] DOB: 03/23/1931 DOA: 03/02/2015 PCP: Nadean Corwin, MD   Brief Narrative:   Erica Owen is an 81 y.o. female with recent hospitalization for treatment of acute blood loss anemia/GI bleeding from peptic ulcer disease (requiring 6 units of blood, discharge hemoglobin 9.3) who was admitted 03/02/15 with chief complaint of fatigue. Hemoglobin on admission was 4.7.  Assessment/Plan:   Principal Problem:   UGIB (upper gastrointestinal bleed)/symptomatic anemia/absolute anemia/upper GI bleed/history of acute gastric ulcer - Status post repeat EGD 03/03/15. Mild-moderate gastritis noted but no residual ulcers and no evidence of bleeding. - Patient refused further screening with colonoscopy. - Hemoglobin 10.2 posttransfusion, trending down. - Received Feraheme 03/04/15. Continue PPI.  Active Problems:   Elevated TSH - Check free T4.    Abnormal EKG / demand ischemia (HCC) - Repeat EKG unchanged. Elevated troponins thought to be from demand ischemia in the setting of severe anemia.    HCAP (healthcare-associated pneumonia) - On empiric vancomycin/Zosyn.  - Repeat CXR does not show convincing evidence of PNA, and no evidence of pneumonia clinically, will D/C antibiotics for now.    Acute encephalopathy/CVA - Multifactorial. Questionable infarct noted on CT scan but patient refused MRI/MRA. - Not felt to be a candidate for antiplatelet therapy given GI bleeding and ongoing anemia. - No focal deficits. Stroke workup completed. Echo unremarkable. Mild carotid artery stenosis noted. - LDL 76, HDL 28. Hemoglobin A1c-5.2 percent.    IDA (iron deficiency anemia) - Given Feraheme 03/04/15.    DVT Prophylaxis - Lovenox ordered.   Family Communication/Anticipated D/C date and plan/Code Status   Family Communication: Daughter updated at bedside. Disposition Plan: Home when hemoglobin stable for 48 hours. Anticipated D/C date:   Possibly  tomorrow if hemoglobin stable. Code Status:     Code Status Orders        Start     Ordered   03/02/15 2042  Full code   Continuous     03/02/15 2041       IV Access:    Peripheral IV   Procedures and diagnostic studies:   Dg Chest 2 View  03/02/2015  CLINICAL DATA:  Cough and fever.  Altered mental status. EXAM: CHEST  2 VIEW COMPARISON:  None. FINDINGS: There is airspace consolidation in the posterior left base. Lungs elsewhere clear. Heart size and pulmonary vascularity are normal. No adenopathy. There is upper thoracic levoscoliosis. IMPRESSION: Airspace consolidation consistent with pneumonia, posterior left base. Followup PA and lateral chest radiographs recommended in 3-4 weeks following trial of antibiotic therapy to ensure resolution and exclude underlying malignancy. Electronically Signed   By: Erica Owen M.D.   On: 03/02/2015 10:20   Ct Head Wo Contrast  03/02/2015  CLINICAL DATA:  Altered mental status EXAM: CT HEAD WITHOUT CONTRAST TECHNIQUE: Contiguous axial images were obtained from the base of the skull through the vertex without intravenous contrast. COMPARISON:  None. FINDINGS: There is age related volume loss. There is no intracranial mass hemorrhage, extra-axial fluid collection, or midline shift. There is a prior focal infarct in the anterior limb of the right internal capsule. There is slight small vessel disease in the centra semiovale bilaterally. On axial slice 14 series 2, there is a small focus of decreased attenuation at the gray - white compartment junction of the right frontal -temporal lobe. This area may represent a small acute infarct. Gray-white compartments elsewhere appear normal. The bony calvarium appears intact. The mastoid air cells are clear.  IMPRESSION: Suspect small early infarct at the gray-white junction of the right posterior frontal -anterior temporal junction. Slight periventricular small vessel disease. Prior small infarct  anterior limb right internal capsule. No hemorrhage or mass effect. Electronically Signed   By: Erica Owen M.D.   On: 03/02/2015 11:04     Medical Consultants:    Dr. Erick Blinks, GI.  Anti-Infectives:   Anti-infectives    Start     Dose/Rate Route Frequency Ordered Stop   03/03/15 0000  vancomycin (VANCOCIN) 500 mg in sodium chloride 0.9 % 100 mL IVPB     500 mg 100 mL/hr over 60 Minutes Intravenous Every 12 hours 03/02/15 1503     03/02/15 1800  piperacillin-tazobactam (ZOSYN) IVPB 3.375 g     3.375 g 12.5 mL/hr over 240 Minutes Intravenous Every 8 hours 03/02/15 1407     03/02/15 1400  piperacillin-tazobactam (ZOSYN) IVPB 3.375 g  Status:  Discontinued     3.375 g 100 mL/hr over 30 Minutes Intravenous 3 times per day 03/02/15 1306 03/02/15 1406   03/02/15 1115  vancomycin (VANCOCIN) IVPB 1000 mg/200 mL premix     1,000 mg 200 mL/hr over 60 Minutes Intravenous  Once 03/02/15 1113 03/02/15 1324   03/02/15 1115  piperacillin-tazobactam (ZOSYN) IVPB 3.375 g     3.375 g 12.5 mL/hr over 240 Minutes Intravenous  Once 03/02/15 1113 03/02/15 1212      Subjective:   Erica Owen denies chest pain, shortness of breath, nausea, vomiting. Overall feels better.  Objective:    Filed Vitals:   03/04/15 0605 03/04/15 1407 03/04/15 2240 03/05/15 0601  BP:  109/50 157/62 138/69  Pulse:  78 70 72  Temp:  97.8 F (36.6 C) 100.2 F (37.9 C) 99.1 F (37.3 C)  TempSrc:  Axillary Oral Oral  Resp:  18 20 20   Height:      Weight: 52.3 kg (115 lb 4.8 oz)   52.708 kg (116 lb 3.2 oz)  SpO2:  97% 93% 95%    Intake/Output Summary (Last 24 hours) at 03/05/15 0919 Last data filed at 03/05/15 9562  Gross per 24 hour  Intake    950 ml  Output    825 ml  Net    125 ml   Filed Weights   03/03/15 0417 03/04/15 0605 03/05/15 0601  Weight: 52 kg (114 lb 10.2 oz) 52.3 kg (115 lb 4.8 oz) 52.708 kg (116 lb 3.2 oz)    Exam: Gen:  NAD Cardiovascular:  RRR, No M/R/G Respiratory:   Lungs CTAB Gastrointestinal:  Abdomen soft, NT/ND, + BS Extremities:  No C/E/C   Data Reviewed:    Labs: Basic Metabolic Panel:  Recent Labs Lab 03/02/15 0902 03/03/15 0200 03/05/15 0510  NA 139 138 136  K 3.7 3.3* 3.6  CL 111 108 106  CO2 24 28 26   GLUCOSE 130* 116* 98  BUN 21* 16 9  CREATININE 0.55 0.68 0.76  CALCIUM 7.8* 8.3* 8.5*  MG  --  2.0  --    GFR Estimated Creatinine Clearance: 45.1 mL/min (by C-G formula based on Cr of 0.76). Liver Function Tests:  Recent Labs Lab 03/02/15 0902 03/03/15 0200  AST 15 19  ALT 11* 14  ALKPHOS 80 84  BILITOT 0.4 2.0*  PROT 4.7* 5.2*  ALBUMIN 2.2* 2.5*   Coagulation profile  Recent Labs Lab 03/02/15 0902 03/03/15 0200  INR 1.16 1.06    CBC:  Recent Labs Lab 03/02/15 0902 03/03/15 0200 03/04/15 0515 03/05/15 0510  WBC 9.6 9.5 8.1 7.5  HGB 4.7* 10.2* 9.0* 8.7*  HCT 15.1* 29.6* 27.4* 26.9*  MCV 92.1 86.8 90.1 90.0  PLT 291 213 233 219   Cardiac Enzymes:  Recent Labs Lab 03/02/15 0902 03/03/15 0200 03/03/15 1040 03/03/15 1555  TROPONINI <0.03 0.18* 0.09* 0.06*   CBG:  Recent Labs Lab 03/03/15 0808 03/04/15 0747  GLUCAP 115* 92   Hgb A1c:  Recent Labs  03/03/15 0200  HGBA1C 5.2   Lipid Profile:  Recent Labs  03/03/15 0200  CHOL 134  HDL 28*  LDLCALC 76  TRIG 960  CHOLHDL 4.8   Thyroid function studies:  Recent Labs  03/03/15 0200  TSH 8.430*   Sepsis Labs:  Recent Labs Lab 03/02/15 0902 03/02/15 0944 03/03/15 0200 03/04/15 0515 03/05/15 0510  WBC 9.6  --  9.5 8.1 7.5  LATICACIDVEN  --  1.5  --   --   --    Microbiology Recent Results (from the past 240 hour(s))  Blood culture (routine x 2)     Status: None (Preliminary result)   Collection Time: 03/02/15  9:39 AM  Result Value Ref Range Status   Specimen Description BLOOD RIGHT FOREARM  Final   Special Requests BOTTLES DRAWN AEROBIC AND ANAEROBIC  Final   Culture   Final    NO GROWTH 2 DAYS Performed  at [facility]    Report Status PENDING  Incomplete  Blood culture (routine x 2)     Status: None (Preliminary result)   Collection Time: 03/02/15  9:45 AM  Result Value Ref Range Status   Specimen Description BLOOD RIGHT ANTECUBITAL  Final   Special Requests BOTTLES DRAWN AEROBIC AND ANAEROBIC  Final   Culture   Final    NO GROWTH 2 DAYS Performed at [facility]    Report Status PENDING  Incomplete  Urine culture     Status: None   Collection Time: 03/02/15 10:24 AM  Result Value Ref Range Status   Specimen Description URINE, CLEAN CATCH  Final   Special Requests NONE  Final   Culture   Final    NO GROWTH 1 DAY Performed at [facility]    Report Status 03/03/2015 FINAL  Final  MRSA PCR Screening     Status: None   Collection Time: 03/02/15  2:58 PM  Result Value Ref Range Status   MRSA by PCR NEGATIVE NEGATIVE Final    Comment:        The GeneXpert MRSA Assay (FDA approved for NASAL specimens only), is one component of a comprehensive MRSA colonization surveillance program. It is not intended to diagnose MRSA infection nor to guide or monitor treatment for MRSA infections.      Medications:   . antiseptic oral rinse  7 mL Mouth Rinse q12n4p  . carvedilol  3.125 mg Oral BID WC  . chlorhexidine  15 mL Mouth Rinse BID  . ferumoxytol  510 mg Intravenous Once  . pantoprazole  40 mg Oral Q0600  . piperacillin-tazobactam (ZOSYN)  IV  3.375 g Intravenous Q8H  . sodium chloride  3 mL Intravenous Q12H  . vancomycin  500 mg Intravenous Q12H   Continuous Infusions:   Time spent: 25 minutes.   LOS: 3 days   Rahkeem Senft  [facility] Pager [phone]. If unable to reach me by pager, please call my cell phone at [phone].  *Please refer to [email or web address], password [email or web address] to get updated schedule on who will round on this  patient, as hospitalists switch teams weekly. If 7PM-7AM, please contact night-coverage at [email or web address], password [email or web address]  for any overnight needs.  03/05/2015, 9:19 AM

## 2015-03-05 NOTE — Care Management Note (Signed)
Case Management Note  Patient Details  Name: Erica Owen MRN: [id number] Date of Birth: Feb 26, 1931  Subjective/Objective:       PNA, anemia             Action/Plan: NCM spoke to pt and gave permission to speak to dtr, Diamond NickelVickie Lo [phone]. NCM spoke to dtr, Vickie and pt will have 24 hour assistance at home. Will stay with grand-dtr who is home all day. No DME needed. Pt did not feel she need HHPT. Waiting final recommendations for home. Will arrange HH if orders placed by attending.    Expected Discharge Date:  03/06/2015               Expected Discharge Plan:  Home w Home Health Services  In-House Referral:     Discharge planning Services  CM Consult   Status of Service:  Completed, signed off  Medicare Important Message Given:    Date Medicare IM Given:    Medicare IM give by:    Date Additional Medicare IM Given:    Additional Medicare Important Message give by:     If discussed at Long Length of Stay Meetings, dates discussed:    Additional Comments:  Elliot CousinShavis, Manya Balash Ellen, RN 03/05/2015, 6:59 PM

## 2015-03-06 ENCOUNTER — Encounter (HOSPITAL_COMMUNITY): Payer: Self-pay | Admitting: Internal Medicine

## 2015-03-06 DIAGNOSIS — K2970 Gastritis, unspecified, without bleeding: Secondary | ICD-10-CM

## 2015-03-06 DIAGNOSIS — N183 Chronic kidney disease, stage 3 unspecified: Secondary | ICD-10-CM

## 2015-03-06 DIAGNOSIS — I6523 Occlusion and stenosis of bilateral carotid arteries: Secondary | ICD-10-CM

## 2015-03-06 DIAGNOSIS — I6529 Occlusion and stenosis of unspecified carotid artery: Secondary | ICD-10-CM

## 2015-03-06 HISTORY — DX: Chronic kidney disease, stage 3 unspecified: N18.30

## 2015-03-06 HISTORY — DX: Occlusion and stenosis of unspecified carotid artery: I65.29

## 2015-03-06 HISTORY — DX: Gastritis, unspecified, without bleeding: K29.70

## 2015-03-06 LAB — CBC
HEMATOCRIT: 30.1 % — AB (ref 36.0–46.0)
Hemoglobin: 9.7 g/dL — ABNORMAL LOW (ref 12.0–15.0)
MCH: 29.9 pg (ref 26.0–34.0)
MCHC: 32.2 g/dL (ref 30.0–36.0)
MCV: 92.9 fL (ref 78.0–100.0)
PLATELETS: 222 10*3/uL (ref 150–400)
RBC: 3.24 MIL/uL — AB (ref 3.87–5.11)
RDW: 19.2 % — AB (ref 11.5–15.5)
WBC: 6.9 10*3/uL (ref 4.0–10.5)

## 2015-03-06 LAB — T4, FREE: FREE T4: 0.9 ng/dL (ref 0.61–1.12)

## 2015-03-06 LAB — GLUCOSE, CAPILLARY: Glucose-Capillary: 91 mg/dL (ref 65–99)

## 2015-03-06 MED ORDER — POLYETHYLENE GLYCOL 3350 17 G PO PACK: 17.0000 g | PACK | Freq: Every day | ORAL | Status: DC

## 2015-03-06 MED ORDER — CARVEDILOL 3.125 MG PO TABS
3.1250 mg | ORAL_TABLET | Freq: Two times a day (BID) | ORAL | Status: DC
Start: 2015-03-06 — End: 2015-05-03

## 2015-03-06 MED ORDER — ALBUTEROL SULFATE HFA 108 (90 BASE) MCG/ACT IN AERS: 2.0000 | INHALATION_SPRAY | Freq: Four times a day (QID) | RESPIRATORY_TRACT | Status: DC | PRN

## 2015-03-06 MED ORDER — POLYSACCHARIDE IRON COMPLEX 150 MG PO CAPS: 150.0000 mg | ORAL_CAPSULE | Freq: Two times a day (BID) | ORAL | Status: DC

## 2015-03-06 MED ORDER — POTASSIUM CHLORIDE CRYS ER 20 MEQ PO TBCR
40.0000 meq | EXTENDED_RELEASE_TABLET | Freq: Once | ORAL | Status: AC
  Administered 2015-03-06: 40 meq via ORAL
  Filled 2015-03-06: qty 2

## 2015-03-06 MED ORDER — MAGNESIUM OXIDE 400 (241.3 MG) MG PO TABS
800.0000 mg | ORAL_TABLET | Freq: Once | ORAL | Status: AC
  Administered 2015-03-06: 800 mg via ORAL
  Filled 2015-03-06: qty 2

## 2015-03-06 NOTE — Discharge Summary (Signed)
Physician Discharge Summary  Foye Haggart ZOX:[id number] DOB: 1930/06/05 DOA: 03/02/2015  PCP: Nadean Corwin, MD  Admit date: 03/02/2015 Discharge date: 03/06/2015   Recommendations for Outpatient Follow-Up:   1. The patient does not need further PT/OT, and CVA not felt to be present, as initially suspected.   Discharge Diagnosis:   Principal Problem:    UGIB (upper gastrointestinal bleed) secondary to gastritis Active Problems:    Symptomatic anemia    Abnormal EKG    Suspected HCAP (healthcare-associated pneumonia)    Acute blood loss anemia    Acute encephalopathy    Acute gastric ulcer    Absolute anemia    Rule out CVA (cerebral infarction)    IDA (iron deficiency anemia)    History of gastric ulcer    Demand ischemia (HCC)    Elevated TSH    Carotid artery stenosis    Stage III CKD   Discharge disposition:  Home.    Discharge Condition: Improved.  Diet recommendation: Low sodium, heart healthy.     History of Present Illness:   Leontine Radman is an 81 y.o. female with recent hospitalization for treatment of acute blood loss anemia/GI bleeding from peptic ulcer disease (requiring 6 units of blood, discharge hemoglobin 9.3) who was admitted 03/02/15 with chief complaint of fatigue. Hemoglobin on admission was 4.7.   Hospital Course by Problem:   Principal Problem:  UGIB (upper gastrointestinal bleed)/symptomatic anemia/absolute anemia/upper GI bleed/history of acute gastric ulcer - Status post repeat EGD 03/03/15. Mild-moderate gastritis noted but no residual ulcers and no evidence of bleeding. - Patient refused further screening with colonoscopy. - Hemoglobin stable over the past 48 hours. - Received Feraheme 03/04/15. D/C home on oral iron therapy.  Continue PPI.  Active Problems:  Elevated TSH - Free T4 WNL.   Abnormal EKG / demand ischemia (HCC) - Repeat EKG unchanged. Elevated troponins thought to be from demand ischemia in the  setting of severe anemia.   HCAP (healthcare-associated pneumonia) - No evidence of pneumonia clinically and repeat chest x-ray not convincing so empiric antibiotics discontinued 03/05/15.    Acute encephalopathy/ Rule out CVA / Carotid artery stenosis - Acute encephalopathy ultifactorial. Questionable infarct noted on CT scan but patient refused MRI/MRA. - No focal neurological deficits, or clinical suspicion of stroke. - Not felt to be a candidate for antiplatelet therapy given GI bleeding and ongoing anemia. - Stroke workup completed. Echo unremarkable. Mild carotid artery stenosis noted. - LDL 76, HDL 28. Hemoglobin A1c-5.2 percent.   IDA (iron deficiency anemia) - Given Feraheme 03/04/15. D/C home on oral iron therapy.      Stage III CKD - Creatinine stable, consistent with usual baseline values.    Medical Consultants:    Dr. Erick Blinks, GI   Discharge Exam:   Filed Vitals:   03/06/15 1055  BP: 122/62  Pulse: 66  Temp: 98.2 F (36.8 C)  Resp:    Filed Vitals:   03/05/15 2139 03/06/15 0500 03/06/15 0610 03/06/15 1055  BP: 118/63  126/70 122/62  Pulse: 64  64 66  Temp: 99.9 F (37.7 C)  98.4 F (36.9 C) 98.2 F (36.8 C)  TempSrc: Oral  Oral Oral  Resp: 18  18   Height:      Weight:  50.894 kg (112 lb 3.2 oz)    SpO2: 96%  96% 98%    Gen:  NAD Cardiovascular:  RRR, No M/R/G Respiratory: Lungs diminished/CTAB Gastrointestinal: Abdomen soft, NT/ND with normal active bowel sounds. Extremities: No C/E/C  The results of significant diagnostics from this hospitalization (including imaging, microbiology, ancillary and laboratory) are listed below for reference.     Procedures and Diagnostic Studies:   Dg Chest 2 View  03/02/2015  CLINICAL DATA:  Cough and fever.  Altered mental status. EXAM: CHEST  2 VIEW COMPARISON:  None. FINDINGS: There is airspace consolidation in the posterior left base. Lungs elsewhere clear. Heart size and pulmonary vascularity  are normal. No adenopathy. There is upper thoracic levoscoliosis. IMPRESSION: Airspace consolidation consistent with pneumonia, posterior left base. Followup PA and lateral chest radiographs recommended in 3-4 weeks following trial of antibiotic therapy to ensure resolution and exclude underlying malignancy. Electronically Signed   By: Bretta BangWilliam  Woodruff III M.D.   On: 03/02/2015 10:20   Ct Head Wo Contrast  03/02/2015  CLINICAL DATA:  Altered mental status EXAM: CT HEAD WITHOUT CONTRAST TECHNIQUE: Contiguous axial images were obtained from the base of the skull through the vertex without intravenous contrast. COMPARISON:  None. FINDINGS: There is age related volume loss. There is no intracranial mass hemorrhage, extra-axial fluid collection, or midline shift. There is a prior focal infarct in the anterior limb of the right internal capsule. There is slight small vessel disease in the centra semiovale bilaterally. On axial slice 14 series 2, there is a small focus of decreased attenuation at the gray - white compartment junction of the right frontal -temporal lobe. This area may represent a small acute infarct. Gray-white compartments elsewhere appear normal. The bony calvarium appears intact. The mastoid air cells are clear. IMPRESSION: Suspect small early infarct at the gray-white junction of the right posterior frontal -anterior temporal junction. Slight periventricular small vessel disease. 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 No hemorrhage or mass effect. Electronically Signed   By: Bretta BangWilliam  Woodruff III M.D.   On: 03/02/2015 11:04   Carotid Dopplers 03/02/15 Summary:  - Findings consistent with 40 - 59 percent stenosis involving the  right internal carotid artery. - Findings consistent with 1-39 percent stenosis involving the left  internal carotid artery. - Findings consistent with stenosis involving the left external  carotid artery. - Right vertebral artery flow is  bidirectional. Right brachial  waveform is abnormal. Left vertebral artery flow is antegrade.  Left brachial waveform is normal.  2 D Echo 03/02/15  Study Conclusions  - Left ventricle: The cavity size was normal. There was mild concentric hypertrophy. Systolic function was normal. The estimated ejection fraction was in the range of 55% to 60%. Wall motion was normal; there were no regional wall motion abnormalities. Doppler parameters are consistent with abnormal left ventricular relaxation (grade 1 diastolic dysfunction). - Left atrium: The atrium was mildly dilated.  Impressions:  - No cardiac source of embolism was identified, but cannot be ruled out on the basis of this examination.   Labs:   Basic Metabolic Panel:  Recent Labs Lab 03/02/15 0902 03/03/15 0200 03/05/15 0510  NA 139 138 136  K 3.7 3.3* 3.6  CL 111 108 106  CO2 24 28 26   GLUCOSE 130* 116* 98  BUN 21* 16 9  CREATININE 0.55 0.68 0.76  CALCIUM 7.8* 8.3* 8.5*  MG  --  2.0  --    GFR Estimated Creatinine Clearance: 43.6 mL/min (by C-G formula based on Cr of 0.76). Liver Function Tests:  Recent Labs Lab 03/02/15 0902 03/03/15 0200  AST 15 19  ALT 11* 14  ALKPHOS 80 84  BILITOT 0.4 2.0*  PROT 4.7* 5.2*  ALBUMIN 2.2* 2.5*  Coagulation profile  Recent Labs Lab 03/02/15 0902 03/03/15 0200  INR 1.16 1.06    CBC:  Recent Labs Lab 03/02/15 0902 03/03/15 0200 03/04/15 0515 03/05/15 0510 03/06/15 0539  WBC 9.6 9.5 8.1 7.5 6.9  HGB 4.7* 10.2* 9.0* 8.7* 9.7*  HCT 15.1* 29.6* 27.4* 26.9* 30.1*  MCV 92.1 86.8 90.1 90.0 92.9  PLT 291 213 233 219 222   Cardiac Enzymes:  Recent Labs Lab 03/02/15 0902 03/03/15 0200 03/03/15 1040 03/03/15 1555  TROPONINI <0.03 0.18* 0.09* 0.06*   BNP: Invalid input(s): POCBNP CBG:  Recent Labs Lab 03/03/15 0808 03/04/15 0747 03/05/15 0732 03/06/15 0814  GLUCAP 115* 92 89 91   Microbiology Recent Results (from the past 240  hour(s))  Blood culture (routine x 2)     Status: None (Preliminary result)   Collection Time: 03/02/15  9:39 AM  Result Value Ref Range Status   Specimen Description BLOOD RIGHT FOREARM  Final   Special Requests BOTTLES DRAWN AEROBIC AND ANAEROBIC  Final   Culture   Final    NO GROWTH 4 DAYS Performed at [facility]    Report Status PENDING  Incomplete  Blood culture (routine x 2)     Status: None (Preliminary result)   Collection Time: 03/02/15  9:45 AM  Result Value Ref Range Status   Specimen Description BLOOD RIGHT ANTECUBITAL  Final   Special Requests BOTTLES DRAWN AEROBIC AND ANAEROBIC  Final   Culture   Final    NO GROWTH 4 DAYS Performed at [facility]    Report Status PENDING  Incomplete  Urine culture     Status: None   Collection Time: 03/02/15 10:24 AM  Result Value Ref Range Status   Specimen Description URINE, CLEAN CATCH  Final   Special Requests NONE  Final   Culture   Final    NO GROWTH 1 DAY Performed at [facility]    Report Status 03/03/2015 FINAL  Final  MRSA PCR Screening     Status: None   Collection Time: 03/02/15  2:58 PM  Result Value Ref Range Status   MRSA by PCR NEGATIVE NEGATIVE Final    Comment:        The GeneXpert MRSA Assay (FDA approved for NASAL specimens only), is one component of a comprehensive MRSA colonization surveillance program. It is not intended to diagnose MRSA infection nor to guide or monitor treatment for MRSA infections.      Discharge Instructions:   Discharge Instructions    Call MD for:  extreme fatigue    Complete by:  As directed      Call MD for:  persistant dizziness or light-headedness    Complete by:  As directed      Call MD for:  persistant nausea and vomiting    Complete by:  As directed      Call MD for:  severe uncontrolled pain    Complete by:  As directed      Diet - low sodium heart healthy    Complete by:  As directed      Increase activity slowly     Complete by:  As directed             Medication List    TAKE these medications        acetaminophen 500 MG tablet  Commonly known as:  TYLENOL  Take 500 mg by mouth every 6 (six) hours as needed for mild pain.  albuterol 108 (90 BASE) MCG/ACT inhaler  Commonly known as:  PROVENTIL HFA;VENTOLIN HFA  Inhale 2 puffs into the lungs every 6 (six) hours as needed for wheezing or shortness of breath.     carvedilol 3.125 MG tablet  Commonly known as:  COREG  Take 1 tablet (3.125 mg total) by mouth 2 (two) times daily with a meal.     iron polysaccharides 150 MG capsule  Commonly known as:  NU-IRON  Take 1 capsule (150 mg total) by mouth 2 (two) times daily.     pantoprazole 40 MG tablet  Commonly known as:  PROTONIX  Take 40 mg by mouth 2 (two) times daily.     polyethylene glycol packet  Commonly known as:  MIRALAX  Take 17 g by mouth daily.     sucralfate 1 G tablet  Commonly known as:  CARAFATE  Take 1 g by mouth 4 (four) times daily -  with meals and at bedtime.           Follow-up Information    Follow up with PYRTLE, Carie Caddy, MD In 1 week.   Specialty:  Gastroenterology   Why:  Hospital follow up, check blood counts.   Contact information:   [address] [place] [address] [phone]        Time coordinating discharge: 35 minutes.  Signed:  Solly Derasmo  Pager [phone] [facility] 03/06/2015, 3:19 PM

## 2015-03-06 NOTE — Progress Notes (Signed)
MD via phone given update Tele. Showed Pt with 8 Ventricular Beats. No acute changes with Pt's assessment and VSS. Maintain current plan of care and new orders to be done

## 2015-03-07 LAB — CULTURE, BLOOD (ROUTINE X 2)
CULTURE: NO GROWTH
Culture: NO GROWTH

## 2015-03-08 ENCOUNTER — Ambulatory Visit: Payer: Self-pay | Admitting: Physician Assistant

## 2015-03-10 ENCOUNTER — Other Ambulatory Visit (INDEPENDENT_AMBULATORY_CARE_PROVIDER_SITE_OTHER): Payer: Medicare Other

## 2015-03-10 ENCOUNTER — Encounter: Payer: Self-pay | Admitting: Physician Assistant

## 2015-03-10 ENCOUNTER — Ambulatory Visit (INDEPENDENT_AMBULATORY_CARE_PROVIDER_SITE_OTHER): Payer: Medicare Other | Admitting: Physician Assistant

## 2015-03-10 ENCOUNTER — Telehealth: Payer: Self-pay | Admitting: Gastroenterology

## 2015-03-10 VITALS — BP 144/82 | HR 80 | Ht 64.0 in | Wt 106.4 lb

## 2015-03-10 DIAGNOSIS — D539 Nutritional anemia, unspecified: Secondary | ICD-10-CM | POA: Diagnosis not present

## 2015-03-10 DIAGNOSIS — K274 Chronic or unspecified peptic ulcer, site unspecified, with hemorrhage: Secondary | ICD-10-CM | POA: Diagnosis not present

## 2015-03-10 DIAGNOSIS — K259 Gastric ulcer, unspecified as acute or chronic, without hemorrhage or perforation: Secondary | ICD-10-CM

## 2015-03-10 LAB — CBC WITH DIFFERENTIAL/PLATELET
BASOS PCT: 0.3 % (ref 0.0–3.0)
Basophils Absolute: 0 10*3/uL (ref 0.0–0.1)
EOS ABS: 0.2 10*3/uL (ref 0.0–0.7)
EOS PCT: 2.4 % (ref 0.0–5.0)
HEMATOCRIT: 38 % (ref 36.0–46.0)
Hemoglobin: 12.2 g/dL (ref 12.0–15.0)
LYMPHS PCT: 11.7 % — AB (ref 12.0–46.0)
Lymphs Abs: 0.9 10*3/uL (ref 0.7–4.0)
MCHC: 32.1 g/dL (ref 30.0–36.0)
MCV: 92 fl (ref 78.0–100.0)
Monocytes Absolute: 0.6 10*3/uL (ref 0.1–1.0)
Monocytes Relative: 7.8 % (ref 3.0–12.0)
NEUTROS ABS: 5.7 10*3/uL (ref 1.4–7.7)
Neutrophils Relative %: 77.8 % — ABNORMAL HIGH (ref 43.0–77.0)
PLATELETS: 326 10*3/uL (ref 150.0–400.0)
RBC: 4.13 Mil/uL (ref 3.87–5.11)
RDW: 19.5 % — AB (ref 11.5–15.5)
WBC: 7.4 10*3/uL (ref 4.0–10.5)

## 2015-03-10 MED ORDER — PANTOPRAZOLE SODIUM 40 MG PO TBEC: 40.0000 mg | DELAYED_RELEASE_TABLET | Freq: Two times a day (BID) | ORAL | Status: DC

## 2015-03-10 NOTE — Patient Instructions (Signed)
Your physician has requested that you go to the basement for lab work before leaving today.  Continue Protonix 40 mg twice a day for 2 more weeks then once daily in the morning thereafter.   Call to make a follow-up with Dr. Rhea BeltonPyrtle as needed.

## 2015-03-10 NOTE — Progress Notes (Addendum)
Patient ID: Robbie Louislice Saraceni, female   DOB: 06-22-1930, 81 y.o.   MRN: [id number]   Subjective:    Patient ID: Robbie Louislice Loth, female    DOB: 06-22-1930, 81 y.o.   MRN: [id number]  HPI  Fulton Molelice  Is a pleasant 81 year old white female recently known to Dr.Pyrtle from hospitalization 11 2 through 03/06/2015. She had presented with anemia and heme positive stool. Patient had had [facility] 1 week previous with an upper GI bleed secondary to gastric ulcers. She was discharged home on Protonix and Carafate. Unfortunately she develop recurrent significant weakness and altered mental status and was brought to the emergency room found to have a hemoglobin of 4.9 and heme positive stools. She had had a globin of 4.5 [place] [facility] but was transfused 4 units of packed RBCs.  Patient underwent repeat EGD on 03/03/2015 no ulcers documented but she did have moderate gastritis. Is recommended that she continue PPI therapy and have an IV iron infusion for significant iron deficiency anemia.  Colonoscopy was also discussed with patient and her family. Patient declined colonoscopy and it is documented that she understood that she could have a large polyp or even a colon cancer.  Patient was to have a follow-up hemoglobin in 1 week. Last hemoglobin on 03/06/2015 was 9.7.  Patient comes into the office today with her daughter. She has been feeling good and says that her energy level has been improving daily. She is eating well has no complaints of abdominal pain or nausea. Her stools are dark on oral iron therapy and she is using MiraLAX to counteract constipation. She has not had any melena.  Review of Systems Pertinent positive and negative review of systems were noted in the above HPI section.  All other review of systems was otherwise negative.  Outpatient Encounter Prescriptions as of 03/10/2015  Medication Sig  . acetaminophen (TYLENOL) 500 MG tablet Take 500 mg by  mouth every 6 (six) hours as needed for mild pain.  Marland Kitchen. albuterol (PROVENTIL HFA;VENTOLIN HFA) 108 (90 BASE) MCG/ACT inhaler Inhale 2 puffs into the lungs every 6 (six) hours as needed for wheezing or shortness of breath.  . carvedilol (COREG) 3.125 MG tablet Take 1 tablet (3.125 mg total) by mouth 2 (two) times daily with a meal.  . iron polysaccharides (NU-IRON) 150 MG capsule Take 1 capsule (150 mg total) by mouth 2 (two) times daily.  . pantoprazole (PROTONIX) 40 MG tablet Take 1 tablet (40 mg total) by mouth 2 (two) times daily.  . polyethylene glycol (MIRALAX) packet Take 17 g by mouth daily.  . sucralfate (CARAFATE) 1 G tablet Take 1 g by mouth as needed.   . [DISCONTINUED] pantoprazole (PROTONIX) 40 MG tablet Take 40 mg by mouth 2 (two) times daily.    No facility-administered encounter medications on file as of 03/10/2015.   No Known Allergies Patient Active Problem List   Diagnosis Date Noted  . Carotid artery stenosis 03/06/2015  . CKD (chronic kidney disease), stage III 03/06/2015  . Gastritis 03/06/2015  . Demand ischemia (HCC) 03/05/2015  . Elevated TSH 03/05/2015  . IDA (iron deficiency anemia)   . History of gastric ulcer   . Symptomatic anemia 03/02/2015  . UGIB (upper gastrointestinal bleed) 03/02/2015  . Abnormal EKG 03/02/2015  . Acute blood loss anemia 03/02/2015  . Acute encephalopathy 03/02/2015  . Acute gastric ulcer   . Absolute anemia    Social History   Social History  .  Marital Status: Widowed    Spouse Name: N/A  . Number of Children: 2  . Years of Education: N/A   Occupational History  . retired    Social History Main Topics  . Smoking status: Former Smoker    Types: Cigarettes    Quit date: 03/09/1965  . Smokeless tobacco: Never Used  . Alcohol Use: No  . Drug Use: No  . Sexual Activity: No   Other Topics Concern  . Not on file   Social History Narrative    Ms. Mangus's family history includes Colon cancer in her brother; Heart attack  in her father; Kidney failure in her mother; Lupus in her mother; Pancreatic cancer in her sister.      Objective:    Filed Vitals:   03/10/15 1330  BP: 144/82  Pulse: 80    Physical Exam   Well-developed elderly white female in no acute distress , accompanied by her daughter. Blood pressure 144/82 pulse 80 height 5 foot 4 weight 106. HEENT; nontraumatic cephalic EOMI PERRLA sclera anicteric, Cardiovascular; regular rate and rhythm with S1-S2 no murmur or gallop, Pulmonary ;clear bilaterally, Abdomen ;soft nontender nondistended bowel sounds are active there is no palpable mass or hepatosplenomegaly bowel sounds are present, Rectal ;exam not done, Extremities ;no clubbing cyanosis or edema skin warm and dry, Neuropsych; mood and affect appropriate       Assessment & Plan:   #1 81 yo female with recent Gi bleeding- gastric ulcers on EGD in [facility] 10/24 /16-repeat EGD 11/3- no residual ulcers but did have moderate gastritis #2 severe iron deficiency anemia- not definitely explained by EGD findings- pt declines Colonoscopy #3 CKD #4 carotid stenosis  Plan;  CBC today, follow serially  continue BID PPI x 2 more weeks then QAM long term  Avoid  ASA and NSAIDs  Continue oral Iron BID  Pt and daughter advised to  Call for any evidence of melena or overt bleeding,or recurrence of progressive weakness etc.   Hanley Rispoli S Franny Selvage PA-C 03/10/2015   Cc: Lucky Cowboy, MD  Addendum: Reviewed and agree with ongoing management. Beverley Fiedler, MD

## 2015-03-11 ENCOUNTER — Other Ambulatory Visit: Payer: Self-pay

## 2015-03-11 DIAGNOSIS — D62 Acute posthemorrhagic anemia: Secondary | ICD-10-CM

## 2015-03-17 ENCOUNTER — Other Ambulatory Visit (INDEPENDENT_AMBULATORY_CARE_PROVIDER_SITE_OTHER): Payer: Medicare Other

## 2015-03-17 DIAGNOSIS — D62 Acute posthemorrhagic anemia: Secondary | ICD-10-CM | POA: Diagnosis not present

## 2015-03-17 LAB — CBC WITH DIFFERENTIAL/PLATELET
BASOS ABS: 0 10*3/uL (ref 0.0–0.1)
Basophils Relative: 0.4 % (ref 0.0–3.0)
Eosinophils Absolute: 0.2 10*3/uL (ref 0.0–0.7)
Eosinophils Relative: 2.9 % (ref 0.0–5.0)
HCT: 38 % (ref 36.0–46.0)
Hemoglobin: 12.5 g/dL (ref 12.0–15.0)
LYMPHS ABS: 1 10*3/uL (ref 0.7–4.0)
Lymphocytes Relative: 16.3 % (ref 12.0–46.0)
MCHC: 33 g/dL (ref 30.0–36.0)
MCV: 90.9 fl (ref 78.0–100.0)
MONO ABS: 0.7 10*3/uL (ref 0.1–1.0)
Monocytes Relative: 11.5 % (ref 3.0–12.0)
NEUTROS PCT: 68.9 % (ref 43.0–77.0)
Neutro Abs: 4.3 10*3/uL (ref 1.4–7.7)
PLATELETS: 235 10*3/uL (ref 150.0–400.0)
RBC: 4.18 Mil/uL (ref 3.87–5.11)
RDW: 17.8 % — ABNORMAL HIGH (ref 11.5–15.5)
WBC: 6.3 10*3/uL (ref 4.0–10.5)

## 2015-03-18 ENCOUNTER — Telehealth: Payer: Self-pay | Admitting: Gastroenterology

## 2015-03-18 NOTE — Telephone Encounter (Signed)
Concerned about the patient's hemoglobin. Advised the labs are not reviewed yet, but the hemoglobin is stable at 12.5. Previously it was 12.2.

## 2015-03-22 ENCOUNTER — Telehealth: Payer: Self-pay | Admitting: Internal Medicine

## 2015-03-22 MED ORDER — BIS SUBCIT-METRONID-TETRACYC 140-125-125 MG PO CAPS: 3.0000 | ORAL_CAPSULE | Freq: Three times a day (TID) | ORAL | Status: DC

## 2015-03-22 NOTE — Telephone Encounter (Signed)
Spoke with pharmacist at the [facility] in [place] and they do have the prescription.

## 2015-03-22 NOTE — Telephone Encounter (Signed)
Left message to call back.  Daughter states her mother is positive for h pylori, they received a call from the doctor in [place]. Called Chrissy at [facility] at [phone] and request they fax over the results.

## 2015-03-22 NOTE — Telephone Encounter (Signed)
Spoke with pts daughter and pt is already on protonix bid. Script for prevpac sent to pharmacy.

## 2015-03-22 NOTE — Telephone Encounter (Signed)
Okay for treatment either with Prevpac or Pylera plus twice a day PPI Esp in light of recent bleeding peptic ulcer disease and [facility]

## 2015-03-22 NOTE — Telephone Encounter (Signed)
Spoke with [facility] and they will not fax the results without a faxed, signed release from the pt. Dr. Terri PiedraPyrlte are you willing to treat h pylori without faxed results?

## 2015-03-30 ENCOUNTER — Other Ambulatory Visit (INDEPENDENT_AMBULATORY_CARE_PROVIDER_SITE_OTHER): Payer: Medicare Other

## 2015-03-30 DIAGNOSIS — D509 Iron deficiency anemia, unspecified: Secondary | ICD-10-CM

## 2015-03-30 LAB — CBC WITH DIFFERENTIAL/PLATELET
Basophils Absolute: 0 10*3/uL (ref 0.0–0.1)
Basophils Relative: 0.3 % (ref 0.0–3.0)
EOS ABS: 0.1 10*3/uL (ref 0.0–0.7)
Eosinophils Relative: 1.7 % (ref 0.0–5.0)
HEMATOCRIT: 39.3 % (ref 36.0–46.0)
HEMOGLOBIN: 13 g/dL (ref 12.0–15.0)
LYMPHS PCT: 13.6 % (ref 12.0–46.0)
Lymphs Abs: 1.1 10*3/uL (ref 0.7–4.0)
MCHC: 33.2 g/dL (ref 30.0–36.0)
MCV: 89.8 fl (ref 78.0–100.0)
MONO ABS: 0.7 10*3/uL (ref 0.1–1.0)
MONOS PCT: 9.4 % (ref 3.0–12.0)
Neutro Abs: 5.9 10*3/uL (ref 1.4–7.7)
Neutrophils Relative %: 75 % (ref 43.0–77.0)
Platelets: 218 10*3/uL (ref 150.0–400.0)
RBC: 4.37 Mil/uL (ref 3.87–5.11)
RDW: 16.3 % — ABNORMAL HIGH (ref 11.5–15.5)
WBC: 7.8 10*3/uL (ref 4.0–10.5)

## 2015-03-31 ENCOUNTER — Telehealth: Payer: Self-pay | Admitting: Physician Assistant

## 2015-03-31 ENCOUNTER — Encounter: Payer: Self-pay | Admitting: Gastroenterology

## 2015-03-31 NOTE — Telephone Encounter (Signed)
error 

## 2015-03-31 NOTE — Telephone Encounter (Signed)
Advised of the CBC results

## 2015-04-07 ENCOUNTER — Telehealth: Payer: Self-pay | Admitting: Gastroenterology

## 2015-04-07 MED ORDER — PANTOPRAZOLE SODIUM 40 MG PO TBEC: 40.0000 mg | DELAYED_RELEASE_TABLET | Freq: Every day | ORAL | Status: DC

## 2015-04-07 NOTE — Telephone Encounter (Signed)
Per Amy's last office visit, patient was actually to decrease back down to once daily dosing anyways after 2 weeks of twice daily dosing. Rx has been sent for once daily dosing.

## 2015-05-03 ENCOUNTER — Other Ambulatory Visit: Payer: Self-pay

## 2015-05-03 DIAGNOSIS — D62 Acute posthemorrhagic anemia: Secondary | ICD-10-CM

## 2015-05-03 MED ORDER — CARVEDILOL 3.125 MG PO TABS: 3.1250 mg | ORAL_TABLET | Freq: Two times a day (BID) | ORAL | Status: DC

## 2015-05-03 MED ORDER — PANTOPRAZOLE SODIUM 40 MG PO TBEC: 40.0000 mg | DELAYED_RELEASE_TABLET | Freq: Every day | ORAL | Status: DC

## 2015-05-03 MED ORDER — POLYSACCHARIDE IRON COMPLEX 150 MG PO CAPS: 150.0000 mg | ORAL_CAPSULE | Freq: Two times a day (BID) | ORAL | Status: DC

## 2015-05-17 ENCOUNTER — Other Ambulatory Visit (INDEPENDENT_AMBULATORY_CARE_PROVIDER_SITE_OTHER): Payer: Medicare Other

## 2015-05-17 DIAGNOSIS — D62 Acute posthemorrhagic anemia: Secondary | ICD-10-CM | POA: Diagnosis not present

## 2015-05-17 LAB — CBC WITH DIFFERENTIAL/PLATELET
BASOS ABS: 0 10*3/uL (ref 0.0–0.1)
Basophils Relative: 0.3 % (ref 0.0–3.0)
EOS ABS: 0.1 10*3/uL (ref 0.0–0.7)
Eosinophils Relative: 1.6 % (ref 0.0–5.0)
HCT: 43 % (ref 36.0–46.0)
Hemoglobin: 14.5 g/dL (ref 12.0–15.0)
LYMPHS ABS: 2.4 10*3/uL (ref 0.7–4.0)
LYMPHS PCT: 29 % (ref 12.0–46.0)
MCHC: 33.7 g/dL (ref 30.0–36.0)
MCV: 89.6 fl (ref 78.0–100.0)
MONOS PCT: 8.9 % (ref 3.0–12.0)
Monocytes Absolute: 0.7 10*3/uL (ref 0.1–1.0)
NEUTROS ABS: 5 10*3/uL (ref 1.4–7.7)
NEUTROS PCT: 60.2 % (ref 43.0–77.0)
PLATELETS: 201 10*3/uL (ref 150.0–400.0)
RBC: 4.8 Mil/uL (ref 3.87–5.11)
RDW: 14.6 % (ref 11.5–15.5)
WBC: 8.4 10*3/uL (ref 4.0–10.5)

## 2015-05-18 ENCOUNTER — Other Ambulatory Visit: Payer: Self-pay

## 2015-05-18 DIAGNOSIS — D508 Other iron deficiency anemias: Secondary | ICD-10-CM

## 2015-07-05 ENCOUNTER — Other Ambulatory Visit (INDEPENDENT_AMBULATORY_CARE_PROVIDER_SITE_OTHER): Payer: Medicare Other

## 2015-07-05 ENCOUNTER — Other Ambulatory Visit: Payer: Self-pay

## 2015-07-05 DIAGNOSIS — D649 Anemia, unspecified: Secondary | ICD-10-CM

## 2015-07-05 DIAGNOSIS — D508 Other iron deficiency anemias: Secondary | ICD-10-CM | POA: Diagnosis not present

## 2015-07-05 LAB — CBC WITH DIFFERENTIAL/PLATELET
BASOS ABS: 0 10*3/uL (ref 0.0–0.1)
Basophils Relative: 0.3 % (ref 0.0–3.0)
EOS ABS: 0.2 10*3/uL (ref 0.0–0.7)
Eosinophils Relative: 1.8 % (ref 0.0–5.0)
HEMATOCRIT: 39.5 % (ref 36.0–46.0)
HEMOGLOBIN: 13.5 g/dL (ref 12.0–15.0)
LYMPHS PCT: 29 % (ref 12.0–46.0)
Lymphs Abs: 2.5 10*3/uL (ref 0.7–4.0)
MCHC: 34.1 g/dL (ref 30.0–36.0)
MCV: 89.3 fl (ref 78.0–100.0)
MONOS PCT: 8.2 % (ref 3.0–12.0)
Monocytes Absolute: 0.7 10*3/uL (ref 0.1–1.0)
NEUTROS ABS: 5.2 10*3/uL (ref 1.4–7.7)
Neutrophils Relative %: 60.7 % (ref 43.0–77.0)
PLATELETS: 223 10*3/uL (ref 150.0–400.0)
RBC: 4.42 Mil/uL (ref 3.87–5.11)
RDW: 13.6 % (ref 11.5–15.5)
WBC: 8.5 10*3/uL (ref 4.0–10.5)

## 2015-08-03 ENCOUNTER — Other Ambulatory Visit: Payer: Self-pay | Admitting: Physician Assistant

## 2015-08-03 NOTE — Telephone Encounter (Signed)
If I gave her Coreg would have been a one time thing- needs to get from PCP... Ok to refill iron Rx  x8

## 2015-08-04 ENCOUNTER — Telehealth: Payer: Self-pay | Admitting: *Deleted

## 2015-08-04 MED ORDER — POLYSACCHARIDE IRON COMPLEX 150 MG PO CAPS: 150.0000 mg | ORAL_CAPSULE | Freq: Two times a day (BID) | ORAL | Status: DC

## 2015-08-04 NOTE — Telephone Encounter (Signed)
Patient daughter [facility] on [address].

## 2015-08-04 NOTE — Telephone Encounter (Signed)
Lm for the patient to call me back.  Lm that we denied the Coreg. She will have to get this from her PCP or whoever normally prescribes it .  We can send the Iron Supplements. I need to know which pharmacy. She has 3 pharmacies in our system.

## 2015-08-05 ENCOUNTER — Ambulatory Visit (INDEPENDENT_AMBULATORY_CARE_PROVIDER_SITE_OTHER): Payer: Medicare Other | Admitting: Physician Assistant

## 2015-08-05 ENCOUNTER — Encounter: Payer: Self-pay | Admitting: Physician Assistant

## 2015-08-05 VITALS — BP 130/80 | HR 79 | Temp 97.5°F | Resp 16 | Ht 63.5 in | Wt 109.2 lb

## 2015-08-05 DIAGNOSIS — Z0000 Encounter for general adult medical examination without abnormal findings: Secondary | ICD-10-CM

## 2015-08-05 DIAGNOSIS — D62 Acute posthemorrhagic anemia: Secondary | ICD-10-CM

## 2015-08-05 DIAGNOSIS — E039 Hypothyroidism, unspecified: Secondary | ICD-10-CM | POA: Diagnosis not present

## 2015-08-05 DIAGNOSIS — I5032 Chronic diastolic (congestive) heart failure: Secondary | ICD-10-CM

## 2015-08-05 DIAGNOSIS — K259 Gastric ulcer, unspecified as acute or chronic, without hemorrhage or perforation: Secondary | ICD-10-CM | POA: Diagnosis not present

## 2015-08-05 DIAGNOSIS — E559 Vitamin D deficiency, unspecified: Secondary | ICD-10-CM | POA: Diagnosis not present

## 2015-08-05 DIAGNOSIS — R6889 Other general symptoms and signs: Secondary | ICD-10-CM | POA: Diagnosis not present

## 2015-08-05 DIAGNOSIS — Z0001 Encounter for general adult medical examination with abnormal findings: Secondary | ICD-10-CM

## 2015-08-05 DIAGNOSIS — Z79899 Other long term (current) drug therapy: Secondary | ICD-10-CM | POA: Diagnosis not present

## 2015-08-05 DIAGNOSIS — E2839 Other primary ovarian failure: Secondary | ICD-10-CM

## 2015-08-05 LAB — HEPATIC FUNCTION PANEL
ALBUMIN: 4.1 g/dL (ref 3.6–5.1)
ALT: 11 U/L (ref 6–29)
AST: 17 U/L (ref 10–35)
Alkaline Phosphatase: 114 U/L (ref 33–130)
BILIRUBIN INDIRECT: 0.6 mg/dL (ref 0.2–1.2)
Bilirubin, Direct: 0.1 mg/dL (ref <=0.2)
TOTAL PROTEIN: 7.1 g/dL (ref 6.1–8.1)
Total Bilirubin: 0.7 mg/dL (ref 0.2–1.2)

## 2015-08-05 LAB — BASIC METABOLIC PANEL WITH GFR
BUN: 18 mg/dL (ref 7–25)
CALCIUM: 9.7 mg/dL (ref 8.6–10.4)
CO2: 26 mmol/L (ref 20–31)
CREATININE: 0.68 mg/dL (ref 0.60–0.88)
Chloride: 105 mmol/L (ref 98–110)
GFR, Est Non African American: 82 mL/min (ref >=60)
GLUCOSE: 80 mg/dL (ref 65–99)
Potassium: 4.8 mmol/L (ref 3.5–5.3)
Sodium: 140 mmol/L (ref 135–146)

## 2015-08-05 LAB — TSH: TSH: 5.47 m[IU]/L — AB

## 2015-08-05 MED ORDER — PANTOPRAZOLE SODIUM 40 MG PO TBEC: 40.0000 mg | DELAYED_RELEASE_TABLET | Freq: Every day | ORAL | Status: DC

## 2015-08-05 MED ORDER — BISOPROLOL-HYDROCHLOROTHIAZIDE 5-6.25 MG PO TABS: 1.0000 | ORAL_TABLET | Freq: Every day | ORAL | Status: DC

## 2015-08-05 NOTE — Progress Notes (Signed)
MEDICARE ANNUAL WELLNESS VISIT AND CPE  Assessment:   1. Medication management - BASIC METABOLIC PANEL WITH GFR - Hepatic function panel  2. Chronic diastolic heart failure (HCC) Continue BP medication, switch twice a day to once a day ziac 5mg   3. Acute blood loss anemia continue follow up GI, will add probiotic and vitamin C to iron  4. Gastric ulcer, unspecified chronicity Continue GI follow up  5. Estrogen deficiency - DG Bone Density; Future  6. Vitamin D deficiency - VITAMIN D 25 Hydroxy (Vit-D Deficiency, Fractures)  7. Hypothyroidism, unspecified hypothyroidism type Was abnormal in the hospital- recheck - TSH  8. Medicare visit Declines vaccines and preventative exams, understands risk of cancer, death, etc but patient states she would not have treatment if found so declines testing, we will follow her wishes.   Over 40 minutes of exam, counseling, chart review and critical decision making was performed  Plan:   During the course of the visit the patient was educated and counseled about appropriate screening and preventive services including:    Pneumococcal vaccine   Prevnar 13  Influenza vaccine  Td vaccine  Screening electrocardiogram  Bone densitometry screening  Colorectal cancer screening  Diabetes screening  Glaucoma screening  Nutrition counseling   Advanced directives: requested  Conditions/risks identified: BMI: Discussed weight loss, diet, and increase physical activity.  Increase physical activity: AHA recommends 150 minutes of physical activity a week.  Medications reviewed Urinary Incontinence is not an issue: discussed non pharmacology and pharmacology options.  Fall risk: low- discussed PT, home fall assessment, medications.    Subjective:  Erica Owen is a 82 y.o. female who presents for Medicare Annual Wellness Visit and new patient to the practice  Date of last medicare wellness visit is unknown. No PCP prior to this, has  not seen doctor since was in 30's.   Patient was recently admitted to the hospital on 03/02/2015 to 03/06/2015 for anemia secondary to upper GI bleed secondary to gastric ulcers. Given PPI/iron infusion and patient declined colonoscopy at that time.    Her blood pressure has been controlled at home, today their BP is BP: 130/80 mmHg She does not workout. She denies chest pain, shortness of breath, dizziness.  She is not on cholesterol medication and denies myalgias. Her cholesterol is at goal. The cholesterol last visit was:   Lab Results  Component Value Date   CHOL 134 03/03/2015   HDL 28* 03/03/2015   LDLCALC 76 03/03/2015   TRIG 149 03/03/2015   CHOLHDL 4.8 03/03/2015   . Last A1C in the office was:  Lab Results  Component Value Date   HGBA1C 5.2 03/03/2015    Medication Review: Current Outpatient Prescriptions on File Prior to Visit  Medication Sig Dispense Refill  . acetaminophen (TYLENOL) 500 MG tablet Take 500 mg by mouth every 6 (six) hours as needed for mild pain.    Marland Kitchen. albuterol (PROVENTIL HFA;VENTOLIN HFA) 108 (90 BASE) MCG/ACT inhaler Inhale 2 puffs into the lungs every 6 (six) hours as needed for wheezing or shortness of breath. 1 Inhaler 2  . iron polysaccharides (NU-IRON) 150 MG capsule Take 1 capsule (150 mg total) by mouth 2 (two) times daily. 60 capsule 8   No current facility-administered medications on file prior to visit.    Current Problems (verified) Patient Active Problem List   Diagnosis Date Noted  . Carotid artery stenosis 03/06/2015  . CKD (chronic kidney disease), stage III 03/06/2015  . Gastritis 03/06/2015  . Demand ischemia (  HCC) 03/05/2015  . Elevated TSH 03/05/2015  . IDA (iron deficiency anemia)   . History of gastric ulcer   . Symptomatic anemia 03/02/2015  . UGIB (upper gastrointestinal bleed) 03/02/2015  . Abnormal EKG 03/02/2015  . Acute blood loss anemia 03/02/2015  . Acute encephalopathy 03/02/2015  . Acute gastric ulcer   .  Absolute anemia     Screening Tests Preventative care: Last colonoscopy: declines Last mammogram: declines Last pap smear/pelvic exam: declines  DEXA:will think about it EGD 03/2015 CT head 2016 CXR 03/2015 Echo 03/2015 55-60%, grade 1 diastolic  Prior vaccinations: TD or Tdap: declines  Influenza: declines  Pneumococcal: declines Prevnar13: will consider Shingles/Zostavax: declines  Names of Other Physician/Practitioners you currently use: 1. [facility] here for primary care 2. None, eye doctor, last visit declines 3. None, dentist, has dentures.  Dr. Rhea Belton Patient Care Team: Lucky Cowboy, MD as PCP - General (Internal Medicine)  SURGICAL HISTORY Past Surgical History  Procedure Laterality Date  . Abdominal hysterectomy    . Esophagogastroduodenoscopy (egd) with propofol N/A 03/03/2015    Procedure: ESOPHAGOGASTRODUODENOSCOPY (EGD) WITH PROPOFOL;  Surgeon: Beverley Fiedler, MD;  Location: [facility];  Service: Endoscopy;  Laterality: N/A;   FAMILY HISTORY Family History  Problem Relation Age of Onset  . Lupus Mother   . Kidney failure Mother   . Heart attack Father   . Colon cancer Brother   . Pancreatic cancer Sister    SOCIAL HISTORY Social History  Substance Use Topics  . Smoking status: Former Smoker    Types: Cigarettes    Quit date: 03/09/1965  . Smokeless tobacco: Never Used  . Alcohol Use: No    MEDICARE WELLNESS OBJECTIVES: Tobacco use: She does not smoke.  Patient is a former smoker. If yes, counseling given Alcohol Current alcohol use: none Osteoporosis: postmenopausal estrogen deficiency and dietary calcium and/or vitamin D deficiency, History of fracture in the past year: no Fall risk: Low Risk Hearing: normal Visual acuity: normal,  does not perform annual eye exam Diet: in general, a "healthy" diet   Physical activity: Current Exercise Habits: The patient does not participate in regular exercise  at present Cardiac risk factors: Cardiac Risk Factors include: advanced age (>66men, >37 women);hypertension;sedentary lifestyle Depression/mood screen:   Depression screen [facility] 2/9 08/05/2015  Decreased Interest 0  Down, Depressed, Hopeless 0  PHQ - 2 Score 0    ADLs:  In your present state of health, do you have any difficulty performing the following activities: 08/05/2015 03/02/2015  Hearing? N -  Vision? N -  Difficulty concentrating or making decisions? N -  Walking or climbing stairs? N -  Dressing or bathing? N -  Doing errands, shopping? Y N  Preparing Food and eating ? N -  Using the Toilet? N -  In the past six months, have you accidently leaked urine? N -  Do you have problems with loss of bowel control? N -  Managing your Medications? N -  Managing your Finances? N -  Housekeeping or managing your Housekeeping? N -     Cognitive Testing  Alert? Yes  Normal Appearance?Yes  Oriented to person? Yes  Place? Yes   Time? Yes  Recall of three objects?  Yes  Can perform simple calculations? Yes  Displays appropriate judgment?Yes  Can read the correct time from a watch face?Yes  EOL planning: Does patient have an advance directive?: No Would patient like information on creating an advanced directive?: Yes -  Educational materials given  Review of Systems  Constitutional: Negative.   HENT: Negative.   Eyes: Negative.   Respiratory: Negative.   Cardiovascular: Negative.   Gastrointestinal: Negative.   Genitourinary: Negative.   Musculoskeletal: Negative.   Skin: Negative.   Neurological: Negative.   Endo/Heme/Allergies: Negative.   Psychiatric/Behavioral: Negative.      Objective:     Today's Vitals   08/05/15 1125  BP: 130/80  Pulse: 79  Temp: 97.5 F (36.4 C)  TempSrc: Temporal  Resp: 16  Height: 5' 3.5" (1.613 m)  Weight: 109 lb 3.2 oz (49.533 kg)  SpO2: 99%   Body mass index is 19.04 kg/(m^2).  General appearance: alert, no distress, WD/WN,  female HEENT: normocephalic, sclerae anicteric, TMs pearly, nares patent, no discharge or erythema, pharynx normal Oral cavity: MMM, no lesions Neck: supple, no lymphadenopathy, no thyromegaly, no masses Heart: RRR, normal S1, S2, no murmurs Lungs: CTA bilaterally, no wheezes, rhonchi, or rales Abdomen: +bs, soft, non tender, non distended, no masses, no hepatomegaly, no splenomegaly Musculoskeletal: nontender, no swelling, no obvious deformity Extremities: no edema, no cyanosis, no clubbing Pulses: 2+ symmetric, upper and lower extremities, normal cap refill Neurological: alert, oriented x 3, CN2-12 intact, strength normal upper extremities and lower extremities, sensation normal throughout, DTRs 2+ throughout, no cerebellar signs, gait normal Psychiatric: normal affect, behavior normal, pleasant   Medicare Attestation I have personally reviewed: The patient's medical and social history Their use of alcohol, tobacco or illicit drugs Their current medications and supplements The patient's functional ability including ADLs,fall risks, home safety risks, cognitive, and hearing and visual impairment Diet and physical activities Evidence for depression or mood disorders  The patient's weight, height, BMI, and visual acuity have been recorded in the chart.  I have made referrals, counseling, and provided education to the patient based on review of the above and I have provided the patient with a written personalized care plan for preventive services.     [name], PA-C   08/05/2015

## 2015-08-05 NOTE — Patient Instructions (Signed)
Can add probiotic and vitamin C  to the iron to help absorption  Stop the coreg and start on ziac   Pneumococcal Vaccine, Polyvalent suspension for injection What is this medicine? PNEUMOCOCCAL VACCINE (NEU mo KOK al vak SEEN) is a vaccine used to prevent pneumococcus bacterial infections. These bacteria can cause serious infections like pneumonia, meningitis, and blood infections. This vaccine will lower your chance of getting pneumonia. If you do get pneumonia, it can make your symptoms milder and your illness shorter. This vaccine will not treat an infection and will not cause infection. This vaccine is recommended for infants and young children, adults with certain medical conditions, and adults 65 years or older. This medicine may be used for other purposes; ask your health care provider or pharmacist if you have questions. What should I tell my health care provider before I take this medicine? They need to know if you have any of these conditions: -bleeding problems -fever -immune system problems -an unusual or allergic reaction to pneumococcal vaccine, diphtheria toxoid, other vaccines, latex, other medicines, foods, dyes, or preservatives -pregnant or trying to get pregnant -breast-feeding How should I use this medicine? This vaccine is for injection into a muscle. It is given by a health care professional. A copy of Vaccine Information Statements will be given before each vaccination. Read this sheet carefully each time. The sheet may change frequently. Talk to your pediatrician regarding the use of this medicine in children. While this drug may be prescribed for children as young as 28 weeks old for selected conditions, precautions do apply. Overdosage: If you think you have taken too much of this medicine contact a poison control center or emergency room at once. NOTE: This medicine is only for you. Do not share this medicine with others. What if I miss a dose? It is  important not to miss your dose. Call your doctor or health care professional if you are unable to keep an appointment. What may interact with this medicine? -medicines for cancer chemotherapy -medicines that suppress your immune function -steroid medicines like prednisone or cortisone This list may not describe all possible interactions. Give your health care provider a list of all the medicines, herbs, non-prescription drugs, or dietary supplements you use. Also tell them if you smoke, drink alcohol, or use illegal drugs. Some items may interact with your medicine. What should I watch for while using this medicine? Mild fever and pain should go away in 3 days or less. Report any unusual symptoms to your doctor or health care professional. What side effects may I notice from receiving this medicine? Side effects that you should report to your doctor or health care professional as soon as possible: -allergic reactions like skin rash, itching or hives, swelling of the face, lips, or tongue -breathing problems -confused -fast or irregular heartbeat -fever over 102 degrees F -seizures -unusual bleeding or bruising -unusual muscle weakness Side effects that usually do not require medical attention (report to your doctor or health care professional if they continue or are bothersome): -aches and pains -diarrhea -fever of 102 degrees F or less -headache -irritable -loss of appetite -pain, tender at site where injected -trouble sleeping This list may not describe all possible side effects. Call your doctor for medical advice about side effects. You may report side effects to FDA at 1-800-FDA-1088. Where should I keep my medicine? This does not apply. This vaccine is given in a clinic, pharmacy, doctor's office, or other health care setting and will not  be stored at home. NOTE: This sheet is a summary. It may not cover all possible information. If you have questions about this medicine, talk to  your doctor, pharmacist, or health care provider.    2016, Elsevier/Gold Standard. (2014-01-21 10:27:27) Osteoporosis Osteoporosis is the thinning and loss of density in the bones. Osteoporosis makes the bones more brittle, fragile, and likely to break (fracture). Over time, osteoporosis can cause the bones to become so weak that they fracture after a simple fall. The bones most likely to fracture are the bones in the hip, wrist, and spine. CAUSES  The exact cause is not known. RISK FACTORS Anyone can develop osteoporosis. You may be at greater risk if you have a family history of the condition or have poor nutrition. You may also have a higher risk if you are:   Female.   80 years old or older.  A smoker.  Not physically active.   White or Asian.  Slender. SIGNS AND SYMPTOMS  A fracture might be the first sign of the disease, especially if it results from a fall or injury that would not usually cause a bone to break. Other signs and symptoms include:   Low back and neck pain.  Stooped posture.  Height loss. DIAGNOSIS  To make a diagnosis, your health care provider may:  Take a medical history.  Perform a physical exam.  Order tests, such as:  A bone mineral density test.  A dual-energy X-ray absorptiometry test. TREATMENT  The goal of osteoporosis treatment is to strengthen your bones to reduce your risk of a fracture. Treatment may involve:  Making lifestyle changes, such as:  Eating a diet rich in calcium.  Doing weight-bearing and muscle-strengthening exercises.  Stopping tobacco use.  Limiting alcohol intake.  Taking medicine to slow the process of bone loss or to increase bone density.  Monitoring your levels of calcium and vitamin D. HOME CARE INSTRUCTIONS  Include calcium and vitamin D in your diet. Calcium is important for bone health, and vitamin D helps the body absorb calcium.  Perform weight-bearing and muscle-strengthening exercises as  directed by your health care provider.  Do not use any tobacco products, including cigarettes, chewing tobacco, and electronic cigarettes. If you need help quitting, ask your health care provider.  Limit your alcohol intake.  Take medicines only as directed by your health care provider.  Keep all follow-up visits as directed by your health care provider. This is important.  Take precautions at home to lower your risk of falling, such as:  Keeping rooms well lit and clutter free.  Installing safety rails on stairs.  Using rubber mats in the bathroom and other areas that are often wet or slippery. SEEK IMMEDIATE MEDICAL CARE IF:  You fall or injure yourself.    This information is not intended to replace advice given to you by your health care provider. Make sure you discuss any questions you have with your health care provider.   Document Released: 01/24/2005 Document Revised: 05/07/2014 Document Reviewed: 09/24/2013 Elsevier Interactive Patient Education [employer]2016 Elsevier Inc.

## 2015-08-06 LAB — VITAMIN D 25 HYDROXY (VIT D DEFICIENCY, FRACTURES): VIT D 25 HYDROXY: 23 ng/mL — AB (ref 30–100)

## 2015-08-10 ENCOUNTER — Other Ambulatory Visit: Payer: Self-pay | Admitting: Physician Assistant

## 2015-08-11 ENCOUNTER — Other Ambulatory Visit: Payer: Self-pay

## 2015-08-11 MED ORDER — BISOPROLOL-HYDROCHLOROTHIAZIDE 5-6.25 MG PO TABS: 1.0000 | ORAL_TABLET | Freq: Every day | ORAL | Status: DC

## 2015-08-26 ENCOUNTER — Other Ambulatory Visit: Payer: Self-pay | Admitting: Physician Assistant

## 2015-12-05 ENCOUNTER — Other Ambulatory Visit: Payer: Self-pay | Admitting: *Deleted

## 2015-12-05 MED ORDER — BISOPROLOL-HYDROCHLOROTHIAZIDE 5-6.25 MG PO TABS: 1.0000 | ORAL_TABLET | Freq: Every day | ORAL | 2 refills | Status: DC

## 2015-12-07 ENCOUNTER — Other Ambulatory Visit: Payer: Self-pay | Admitting: *Deleted

## 2015-12-07 MED ORDER — POLYSACCHARIDE IRON COMPLEX 150 MG PO CAPS: 150.0000 mg | ORAL_CAPSULE | Freq: Two times a day (BID) | ORAL | 4 refills | Status: DC

## 2016-02-23 ENCOUNTER — Encounter: Payer: Self-pay | Admitting: Physician Assistant

## 2016-02-23 ENCOUNTER — Ambulatory Visit (INDEPENDENT_AMBULATORY_CARE_PROVIDER_SITE_OTHER): Payer: Medicare Other | Admitting: Physician Assistant

## 2016-02-23 VITALS — BP 132/80 | HR 64 | Temp 98.1°F | Resp 16 | Ht 63.5 in | Wt 117.2 lb

## 2016-02-23 DIAGNOSIS — E559 Vitamin D deficiency, unspecified: Secondary | ICD-10-CM | POA: Diagnosis not present

## 2016-02-23 DIAGNOSIS — D649 Anemia, unspecified: Secondary | ICD-10-CM | POA: Diagnosis not present

## 2016-02-23 DIAGNOSIS — I10 Essential (primary) hypertension: Secondary | ICD-10-CM

## 2016-02-23 DIAGNOSIS — R7989 Other specified abnormal findings of blood chemistry: Secondary | ICD-10-CM

## 2016-02-23 DIAGNOSIS — Z0000 Encounter for general adult medical examination without abnormal findings: Secondary | ICD-10-CM | POA: Diagnosis not present

## 2016-02-23 DIAGNOSIS — I6523 Occlusion and stenosis of bilateral carotid arteries: Secondary | ICD-10-CM

## 2016-02-23 DIAGNOSIS — R946 Abnormal results of thyroid function studies: Secondary | ICD-10-CM | POA: Diagnosis not present

## 2016-02-23 DIAGNOSIS — Z79899 Other long term (current) drug therapy: Secondary | ICD-10-CM | POA: Diagnosis not present

## 2016-02-23 DIAGNOSIS — I5032 Chronic diastolic (congestive) heart failure: Secondary | ICD-10-CM | POA: Insufficient documentation

## 2016-02-23 DIAGNOSIS — I639 Cerebral infarction, unspecified: Secondary | ICD-10-CM

## 2016-02-23 DIAGNOSIS — K253 Acute gastric ulcer without hemorrhage or perforation: Secondary | ICD-10-CM

## 2016-02-23 DIAGNOSIS — N183 Chronic kidney disease, stage 3 unspecified: Secondary | ICD-10-CM

## 2016-02-23 DIAGNOSIS — K2970 Gastritis, unspecified, without bleeding: Secondary | ICD-10-CM

## 2016-02-23 DIAGNOSIS — I248 Other forms of acute ischemic heart disease: Secondary | ICD-10-CM

## 2016-02-23 DIAGNOSIS — R9431 Abnormal electrocardiogram [ECG] [EKG]: Secondary | ICD-10-CM

## 2016-02-23 LAB — IRON AND TIBC
%SAT: 23 % (ref 11–50)
Iron: 62 ug/dL (ref 45–160)
TIBC: 272 ug/dL (ref 250–450)
UIBC: 210 ug/dL (ref 125–400)

## 2016-02-23 LAB — BASIC METABOLIC PANEL WITH GFR
BUN: 16 mg/dL (ref 7–25)
CALCIUM: 9.8 mg/dL (ref 8.6–10.4)
CO2: 26 mmol/L (ref 20–31)
Chloride: 98 mmol/L (ref 98–110)
Creat: 0.99 mg/dL — ABNORMAL HIGH (ref 0.60–0.88)
GFR, EST AFRICAN AMERICAN: 61 mL/min (ref >=60)
GFR, Est Non African American: 53 mL/min — ABNORMAL LOW (ref >=60)
Glucose, Bld: 97 mg/dL (ref 65–99)
POTASSIUM: 4.4 mmol/L (ref 3.5–5.3)
Sodium: 136 mmol/L (ref 135–146)

## 2016-02-23 LAB — HEPATIC FUNCTION PANEL
ALBUMIN: 4.2 g/dL (ref 3.6–5.1)
ALT: 12 U/L (ref 6–29)
AST: 18 U/L (ref 10–35)
Alkaline Phosphatase: 96 U/L (ref 33–130)
BILIRUBIN DIRECT: 0.2 mg/dL (ref <=0.2)
BILIRUBIN TOTAL: 0.9 mg/dL (ref 0.2–1.2)
Indirect Bilirubin: 0.7 mg/dL (ref 0.2–1.2)
Total Protein: 7.2 g/dL (ref 6.1–8.1)

## 2016-02-23 LAB — FERRITIN: FERRITIN: 539 ng/mL — AB (ref 20–288)

## 2016-02-23 LAB — CBC WITH DIFFERENTIAL/PLATELET
BASOS PCT: 0 %
Basophils Absolute: 0 cells/uL (ref 0–200)
EOS ABS: 81 {cells}/uL (ref 15–500)
Eosinophils Relative: 1 %
HCT: 39.4 % (ref 35.0–45.0)
Hemoglobin: 13 g/dL (ref 11.7–15.5)
LYMPHS PCT: 26 %
Lymphs Abs: 2106 cells/uL (ref 850–3900)
MCH: 30.3 pg (ref 27.0–33.0)
MCHC: 33 g/dL (ref 32.0–36.0)
MCV: 91.8 fL (ref 80.0–100.0)
MONOS PCT: 11 %
MPV: 10 fL (ref 7.5–12.5)
Monocytes Absolute: 891 cells/uL (ref 200–950)
Neutro Abs: 5022 cells/uL (ref 1500–7800)
Neutrophils Relative %: 62 %
PLATELETS: 229 10*3/uL (ref 140–400)
RBC: 4.29 MIL/uL (ref 3.80–5.10)
RDW: 13.4 % (ref 11.0–15.0)
WBC: 8.1 10*3/uL (ref 3.8–10.8)

## 2016-02-23 LAB — MAGNESIUM: MAGNESIUM: 1.9 mg/dL (ref 1.5–2.5)

## 2016-02-23 LAB — TSH: TSH: 4.93 m[IU]/L — AB

## 2016-02-23 NOTE — Progress Notes (Signed)
6 month follow up and CPE  Assessment:    1. Demand ischemia (HCC) Monitor CBC, continue BP med  2. Bilateral carotid artery stenosis Control blood pressure, cholesterol, glucose, increase exercise.   3. Acute gastric ulcer, unspecified whether gastric ulcer hemorrhage or perforation present Continue PPI/H2 blocker, diet discussed  4. CKD (chronic kidney disease), stage III Increase fluids, avoid NSAIDS, monitor sugars, will monitor - BASIC METABOLIC PANEL WITH GFR  5. Gastritis, presence of bleeding unspecified, unspecified chronicity, unspecified gastritis type Continue PPI/H2 blocker, diet discussed  6. Elevated TSH - TSH  7. Anemia, unspecified type May decrease iron to one a day and may do further pending labs - Iron and TIBC - Ferritin  8. Abnormal EKG declines  9. Chronic diastolic heart failure (HCC) Control blood pressure, cholesterol, glucose, increase exercise.   10. Vitamin D deficiency - VITAMIN D 25 Hydroxy (Vit-D Deficiency, Fractures)  11. Medication management - CBC with Differential/Platelet - BASIC METABOLIC PANEL WITH GFR - Hepatic function panel - Magnesium  12. Cerebrovascular accident (CVA), unspecified mechanism (HCC) Control blood pressure, cholesterol, glucose, increase exercise.   13. Essential hypertension - Urinalysis, Routine w reflex microscopic (not at [facility]) - Microalbumin / creatinine urine ratio  14. Routine general medical examination at a health care facility Declines vaccines and preventative exams, understands risk of cancer, death, etc but patient states she would not have treatment if found so declines testing, we will follow her wishes.  - CBC with Differential/Platelet - BASIC METABOLIC PANEL WITH GFR - Hepatic function panel - TSH - Magnesium - VITAMIN D 25 Hydroxy (Vit-D Deficiency, Fractures) - Urinalysis, Routine w reflex microscopic (not at [facility] - Microalbumin / creatinine urine ratio - Iron and TIBC -  Ferritin   Over 40 minutes of exam, counseling, chart review and critical decision making was performed    Subjective:  Erica Owen is a 82 y.o. female who presents for CPE and follow up for anemia.   Her blood pressure has been controlled at home, today their BP is BP: 132/80 She does not workout. She denies chest pain, shortness of breath, dizziness.  She is not on cholesterol medication and denies myalgias. Her cholesterol is at goal. The cholesterol last visit was:   Lab Results  Component Value Date   CHOL 134 03/03/2015   HDL 28 (L) 03/03/2015   LDLCALC 76 03/03/2015   TRIG 149 03/03/2015   CHOLHDL 4.8 03/03/2015   . Last A1C in the office was:  Lab Results  Component Value Date   HGBA1C 5.2 03/03/2015    Medication Review: Current Outpatient Prescriptions on File Prior to Visit  Medication Sig Dispense Refill  . acetaminophen (TYLENOL) 500 MG tablet Take 500 mg by mouth every 6 (six) hours as needed for mild pain.    . bisoprolol-hydrochlorothiazide (ZIAC) 5-6.25 MG tablet Take 1 tablet by mouth daily. 30 tablet 2  . iron polysaccharides (NU-IRON) 150 MG capsule Take 1 capsule (150 mg total) by mouth 2 (two) times daily. 60 capsule 4  . pantoprazole (PROTONIX) 40 MG tablet TAKE 1 TABLET BY MOUTH EVERY DAY 30 tablet 0   No current facility-administered medications on file prior to visit.     Current Problems (verified) Patient Active Problem List   Diagnosis Date Noted  . Chronic diastolic heart failure (HCC) 02/23/2016  . Carotid artery stenosis 03/06/2015  . CKD (chronic kidney disease), stage III 03/06/2015  . Gastritis 03/06/2015  . Demand ischemia (HCC) 03/05/2015  . Elevated TSH 03/05/2015  .  Abnormal EKG 03/02/2015  . Acute gastric ulcer   . Absolute anemia     Screening Tests Preventative care: Last colonoscopy: declines Last mammogram: declines Last pap smear/pelvic exam: declines  DEXA: declines  EGD 03/2015 CT head 2016 CXR 03/2015 Echo  03/2015 55-60%, grade 1 diastolic  Prior vaccinations: TD or Tdap: declines  Influenza: declines  Pneumococcal: declines Prevnar13: declines Shingles/Zostavax: declines  Names of Other Physician/Practitioners you currently use: 1. [facility] here for primary care 2. None, eye doctor, last visit declines 3. None, dentist, has dentures.  Dr. Rhea BeltonPyrtle Patient Care Team: Lucky CowboyWilliam McKeown, MD as PCP - General (Internal Medicine)  Allergies No Known Allergies  SURGICAL HISTORY She  has a past surgical history that includes Abdominal hysterectomy and Esophagogastroduodenoscopy (egd) with propofol (N/A, 03/03/2015). FAMILY HISTORY Her family history includes Colon cancer in her brother; Heart attack in her father; Kidney failure in her mother; Lupus in her mother; Pancreatic cancer in her sister. SOCIAL HISTORY She  reports that she quit smoking about 50 years ago. Her smoking use included Cigarettes. She has never used smokeless tobacco. She reports that she does not drink alcohol or use drugs.   Review of Systems  Constitutional: Negative.   HENT: Negative.   Eyes: Negative.   Respiratory: Negative.   Cardiovascular: Negative.   Gastrointestinal: Negative.   Genitourinary: Negative.   Musculoskeletal: Negative.   Skin: Negative.   Neurological: Negative.   Endo/Heme/Allergies: Negative.   Psychiatric/Behavioral: Negative.      Objective:     Today's Vitals   02/23/16 1351  BP: 132/80  Pulse: 64  Resp: 16  Temp: 98.1 F (36.7 C)  SpO2: 99%  Weight: 117 lb 3.2 oz (53.2 kg)  Height: 5' 3.5" (1.613 m)   Body mass index is 20.44 kg/m.  General appearance: alert, no distress, WD/WN, female HEENT: normocephalic, sclerae anicteric, TMs pearly, nares patent, no discharge or erythema, pharynx normal Oral cavity: MMM, no lesions Neck: supple, no lymphadenopathy, no thyromegaly, no masses Heart: RRR, normal S1, S2, no murmurs Lungs:  CTA bilaterally, no wheezes, rhonchi, or rales Abdomen: +bs, soft, non tender, non distended, no masses, no hepatomegaly, no splenomegaly Musculoskeletal: nontender, no swelling, no obvious deformity Extremities: no edema, no cyanosis, no clubbing Pulses: 2+ symmetric, upper and lower extremities, normal cap refill Neurological: alert, oriented x 3, CN2-12 intact, strength normal upper extremities and lower extremities, sensation normal throughout, DTRs 2+ throughout, no cerebellar signs, gait normal Psychiatric: normal affect, behavior normal, pleasant    Quentin Mullingmanda Kayloni Rocco, PA-C   02/23/2016

## 2016-02-24 LAB — URINALYSIS, ROUTINE W REFLEX MICROSCOPIC
BILIRUBIN URINE: NEGATIVE
Glucose, UA: NEGATIVE
KETONES UR: NEGATIVE
NITRITE: NEGATIVE
PH: 6 (ref 5.0–8.0)
Protein, ur: NEGATIVE
SPECIFIC GRAVITY, URINE: 1.006 (ref 1.001–1.035)

## 2016-02-24 LAB — MICROALBUMIN / CREATININE URINE RATIO
Creatinine, Urine: 59 mg/dL (ref 20–320)
MICROALB UR: 0.8 mg/dL
Microalb Creat Ratio: 14 mcg/mg creat (ref <30)

## 2016-02-24 LAB — URINALYSIS, MICROSCOPIC ONLY
Bacteria, UA: NONE SEEN [HPF]
CRYSTALS: NONE SEEN [HPF]
Casts: NONE SEEN [LPF]
RBC / HPF: NONE SEEN RBC/HPF (ref <=2)
Squamous Epithelial / LPF: NONE SEEN [HPF] (ref <=5)
Yeast: NONE SEEN [HPF]

## 2016-02-24 LAB — VITAMIN D 25 HYDROXY (VIT D DEFICIENCY, FRACTURES): Vit D, 25-Hydroxy: 41 ng/mL (ref 30–100)

## 2016-02-29 ENCOUNTER — Other Ambulatory Visit: Payer: Medicare Other

## 2016-02-29 DIAGNOSIS — R319 Hematuria, unspecified: Secondary | ICD-10-CM | POA: Diagnosis not present

## 2016-03-01 LAB — URINALYSIS, ROUTINE W REFLEX MICROSCOPIC
Bilirubin Urine: NEGATIVE
Glucose, UA: NEGATIVE
KETONES UR: NEGATIVE
LEUKOCYTES UA: NEGATIVE
NITRITE: NEGATIVE
PH: 6.5 (ref 5.0–8.0)
Protein, ur: NEGATIVE
SPECIFIC GRAVITY, URINE: 1.004 (ref 1.001–1.035)

## 2016-03-01 LAB — URINALYSIS, MICROSCOPIC ONLY
Bacteria, UA: NONE SEEN [HPF]
CASTS: NONE SEEN [LPF]
CRYSTALS: NONE SEEN [HPF]
RBC / HPF: NONE SEEN RBC/HPF (ref <=2)
Squamous Epithelial / LPF: NONE SEEN [HPF] (ref <=5)
WBC, UA: NONE SEEN WBC/HPF (ref <=5)
YEAST: NONE SEEN [HPF]

## 2016-03-02 LAB — URINE CULTURE: ORGANISM ID, BACTERIA: NO GROWTH

## 2016-06-05 ENCOUNTER — Other Ambulatory Visit: Payer: Self-pay | Admitting: Physician Assistant

## 2016-06-27 ENCOUNTER — Other Ambulatory Visit: Payer: Self-pay | Admitting: Physician Assistant

## 2016-08-04 ENCOUNTER — Other Ambulatory Visit: Payer: Self-pay | Admitting: Internal Medicine

## 2016-08-23 ENCOUNTER — Ambulatory Visit (INDEPENDENT_AMBULATORY_CARE_PROVIDER_SITE_OTHER): Payer: Medicare Other | Admitting: Internal Medicine

## 2016-08-23 ENCOUNTER — Encounter: Payer: Self-pay | Admitting: Internal Medicine

## 2016-08-23 VITALS — BP 164/84 | HR 68 | Temp 97.5°F | Resp 16 | Ht 63.5 in | Wt 113.2 lb

## 2016-08-23 DIAGNOSIS — Z79899 Other long term (current) drug therapy: Secondary | ICD-10-CM | POA: Diagnosis not present

## 2016-08-23 DIAGNOSIS — R7309 Other abnormal glucose: Secondary | ICD-10-CM

## 2016-08-23 DIAGNOSIS — I10 Essential (primary) hypertension: Secondary | ICD-10-CM

## 2016-08-23 DIAGNOSIS — E039 Hypothyroidism, unspecified: Secondary | ICD-10-CM | POA: Diagnosis not present

## 2016-08-23 DIAGNOSIS — E559 Vitamin D deficiency, unspecified: Secondary | ICD-10-CM | POA: Diagnosis not present

## 2016-08-23 DIAGNOSIS — E7889 Other lipoprotein metabolism disorders: Secondary | ICD-10-CM

## 2016-08-23 DIAGNOSIS — K2970 Gastritis, unspecified, without bleeding: Secondary | ICD-10-CM

## 2016-08-23 LAB — CBC WITH DIFFERENTIAL/PLATELET
BASOS ABS: 0 {cells}/uL (ref 0–200)
Basophils Relative: 0 %
EOS PCT: 1 %
Eosinophils Absolute: 77 cells/uL (ref 15–500)
HCT: 39.9 % (ref 35.0–45.0)
Hemoglobin: 13.2 g/dL (ref 11.7–15.5)
Lymphocytes Relative: 20 %
Lymphs Abs: 1540 cells/uL (ref 850–3900)
MCH: 30.1 pg (ref 27.0–33.0)
MCHC: 33.1 g/dL (ref 32.0–36.0)
MCV: 91.1 fL (ref 80.0–100.0)
MONOS PCT: 10 %
MPV: 10.4 fL (ref 7.5–12.5)
Monocytes Absolute: 770 cells/uL (ref 200–950)
NEUTROS PCT: 69 %
Neutro Abs: 5313 cells/uL (ref 1500–7800)
PLATELETS: 222 10*3/uL (ref 140–400)
RBC: 4.38 MIL/uL (ref 3.80–5.10)
RDW: 13.5 % (ref 11.0–15.0)
WBC: 7.7 10*3/uL (ref 3.8–10.8)

## 2016-08-23 LAB — BASIC METABOLIC PANEL WITH GFR
BUN: 14 mg/dL (ref 7–25)
CALCIUM: 10.2 mg/dL (ref 8.6–10.4)
CO2: 26 mmol/L (ref 20–31)
CREATININE: 0.9 mg/dL — AB (ref 0.60–0.88)
Chloride: 98 mmol/L (ref 98–110)
GFR, EST AFRICAN AMERICAN: 68 mL/min (ref >=60)
GFR, Est Non African American: 59 mL/min — ABNORMAL LOW (ref >=60)
Glucose, Bld: 90 mg/dL (ref 65–99)
Potassium: 4.7 mmol/L (ref 3.5–5.3)
SODIUM: 136 mmol/L (ref 135–146)

## 2016-08-23 LAB — HEPATIC FUNCTION PANEL
ALT: 12 U/L (ref 6–29)
AST: 17 U/L (ref 10–35)
Albumin: 4.3 g/dL (ref 3.6–5.1)
Alkaline Phosphatase: 97 U/L (ref 33–130)
BILIRUBIN DIRECT: 0.2 mg/dL (ref <=0.2)
BILIRUBIN TOTAL: 1.1 mg/dL (ref 0.2–1.2)
Indirect Bilirubin: 0.9 mg/dL (ref 0.2–1.2)
Total Protein: 7.3 g/dL (ref 6.1–8.1)

## 2016-08-23 NOTE — Progress Notes (Signed)
This very nice 83 y.o. WWF presents for follow up with Hypertension, Hyperlipidemia, Pre-Diabetes and Vitamin D Deficiency. Patient is also followed for sl elevated TSH's.      In Nov 2016 patient had a syncopal episode at the [facility] and was hospitalized and found to have a GI bleed, was stabilized and released. Upon return to [place], she had a 2sd episode and again had a GI bleed with EGD finding gastritis and since then patient has been on Pantoprazole, iron supplements and Vit C and been doing well w/o complaints of dyspepsia.       Patient is treated for HTN since Nov 2016 & BP has allegedly been controlled at home. Today's BP is elevated at 164/84 and rechecked at 157/77. Patient has had no complaints of any cardiac type chest pain, palpitations, dyspnea/orthopnea/PND, dizziness, claudication, or dependent edema.     Hyperlipidemia is controlled with diet. Patient denies myalgias or other med SE's. Last Lipids were at goal: Lab Results  Component Value Date   CHOL 134 03/03/2015   HDL 28 (L) 03/03/2015   LDLCALC 76 03/03/2015   TRIG 149 03/03/2015   CHOLHDL 4.8 03/03/2015      Also, the patient has history of abnormal glucose and has had no symptoms of reactive hypoglycemia, diabetic polys, paresthesias or visual blurring.  Last A1c was at goal: Lab Results  Component Value Date   HGBA1C 5.2 03/03/2015      Further, the patient also has history of Vitamin D Deficiency and supplements vitamin D without any suspected side-effects. Last vitamin D was still very low   Lab Results  Component Value Date   VD25OH 54 02/23/2016   Current Outpatient Prescriptions on File Prior to Visit  Medication Sig  . Acetaminophen 500 MG tablet Take  every 6  hrs as needed for mild pain.  . bisoprolol-hctz 5-6.25 MG  TAKE ONE TABm DAILY  . NU-IRON 150 MG cap Take 1 cap 2 x daily.  . pantoprazole 40 MG tablet TAKE ONE TAB DAILY   No Known Allergies   PMHx:   Past Medical History:    Diagnosis Date  . Abnormal EKG 03/02/2015  . Acute blood loss anemia 03/02/2015  . Acute encephalopathy 03/02/2015  . Arthritis   . Carotid artery stenosis 03/06/2015  . CKD (chronic kidney disease), stage III 03/06/2015  . Demand ischemia (HCC) 03/05/2015  . Gastric ulcer   . Gastritis 03/06/2015  . IDA (iron deficiency anemia)     There is no immunization history on file for this patient. Past Surgical History:  Procedure Laterality Date  . ABDOMINAL HYSTERECTOMY    . ESOPHAGOGASTRODUODENOSCOPY (EGD) WITH PROPOFOL N/A 03/03/2015   Procedure: ESOPHAGOGASTRODUODENOSCOPY (EGD) WITH PROPOFOL;  Surgeon: Beverley Fiedler, MD;  Location: [facility];  Service: Endoscopy;  Laterality: N/A;   FHx:    Reviewed / unchanged  SHx:    Reviewed / unchanged  Systems Review:  Constitutional: Denies fever, chills, wt changes, headaches, insomnia, fatigue, night sweats, change in appetite. Eyes: Denies redness, blurred vision, diplopia, discharge, itchy, watery eyes.  ENT: Denies discharge, congestion, post nasal drip, epistaxis, sore throat, earache, hearing loss, dental pain, tinnitus, vertigo, sinus pain, snoring.  CV: Denies chest pain, palpitations, irregular heartbeat, syncope, dyspnea, diaphoresis, orthopnea, PND, claudication or edema. Respiratory: denies cough, dyspnea, DOE, pleurisy, hoarseness, laryngitis, wheezing.  Gastrointestinal: Denies dysphagia, odynophagia, heartburn, reflux, water brash, abdominal pain or cramps, nausea, vomiting, bloating, diarrhea, constipation, hematemesis, melena, hematochezia  or hemorrhoids.  Genitourinary: Denies dysuria, frequency, urgency, nocturia, hesitancy, discharge, hematuria or flank pain. Musculoskeletal: Denies arthralgias, myalgias, stiffness, jt. swelling, pain, limping or strain/sprain.  Skin: Denies pruritus, rash, hives, warts, acne, eczema or change in skin lesion(s). Neuro: No weakness, tremor, incoordination, spasms, paresthesia or  pain. Psychiatric: Denies confusion, memory loss or sensory loss. Endo: Denies change in weight, skin or hair change.  Heme/Lymph: No excessive bleeding, bruising or enlarged lymph nodes.  Physical Exam  BP (!) 164/84   Pulse 68   Temp 97.5 F (36.4 C)   Resp 16   Ht 5' 3.5" (1.613 m)   Wt 113 lb 3.2 oz (51.3 kg)   BMI 19.74 kg/m   Appears very thin, marginally nourished, well groomed  and in no distress.  Eyes: PERRLA, EOMs, conjunctiva no swelling or erythema. Sinuses: No frontal/maxillary tenderness ENT/Mouth: EAC's clear, TM's nl w/o erythema, bulging. Nares clear w/o erythema, swelling, exudates. Oropharynx clear without erythema or exudates. Oral hygiene is good. Tongue normal, non obstructing. Hearing intact.  Neck: Supple. Thyroid nl. Car 2+/2+ without bruits, nodes or JVD. Chest: Respirations nl with BS clear & equal w/o rales, rhonchi, wheezing or stridor.  Cor: Heart sounds normal w/ regular rate and rhythm without sig. murmurs, gallops, clicks or rubs. Peripheral pulses normal and equal  without edema.  Abdomen: Soft & bowel sounds normal. Non-tender w/o guarding, rebound, hernias, masses or organomegaly.  Lymphatics: Unremarkable.  Musculoskeletal: Full ROM all peripheral extremities, joint stability, 5/5 strength and normal gait.  Skin: Warm, dry without exposed rashes, lesions or ecchymosis apparent.  Neuro: Cranial nerves intact, reflexes equal bilaterally. Sensory-motor testing grossly intact. Tendon reflexes grossly intact.  Pysch: Alert & oriented x 3.  Insight and judgement nl & appropriate. No ideations.  Assessment and Plan:  1. Essential hypertension  - Continue medication, monitor blood pressure at home.  - Continue DASH diet. Reminder to go to the ER if any CP,  SOB, nausea, dizziness, severe HA, changes vision/speech,  left arm numbness and tingling and jaw pain.  - CBC with Differential/Platelet - BASIC METABOLIC PANEL WITH GFR - Magnesium -  TSH  2. Lipids abnormal  - Continue diet/meds, exercise,& lifestyle modifications.  - Continue monitor periodic cholesterol/liver & renal functions   - Hepatic function panel - TSH  3. Abnormal glucose  - Hemoglobin A1c - Insulin, random  4. Vitamin D deficiency  - Continue diet, exercise, lifestyle modifications.  - Monitor appropriate labs.  - Continue supplementation. - VITAMIN D 25 Hydroxy   5. Acquired hypothyroidism   6. Gastritis, hx/o   7. Medication management  - CBC with Differential/Platelet - BASIC METABOLIC PANEL WITH GFR - Hepatic function panel - Magnesium - TSH - Hemoglobin A1c - Insulin, random - VITAMIN D 25 Hydroxy        Discussed  regular exercise, BP monitoring, weight control to achieve/maintain BMI less than 25 and discussed med and SE's. Recommended labs to assess and monitor clinical status with further disposition pending results of labs. Over 30 minutes of exam, counseling, chart review was performed.

## 2016-08-23 NOTE — Patient Instructions (Signed)

## 2016-08-24 LAB — HEMOGLOBIN A1C
Hgb A1c MFr Bld: 5.3 % (ref <5.7)
MEAN PLASMA GLUCOSE: 105 mg/dL

## 2016-08-24 LAB — INSULIN, RANDOM: INSULIN: 3.2 u[IU]/mL (ref 2.0–19.6)

## 2016-08-24 LAB — TSH: TSH: 5.17 m[IU]/L — AB

## 2016-08-24 LAB — MAGNESIUM: MAGNESIUM: 2 mg/dL (ref 1.5–2.5)

## 2016-08-24 LAB — VITAMIN D 25 HYDROXY (VIT D DEFICIENCY, FRACTURES): Vit D, 25-Hydroxy: 54 ng/mL (ref 30–100)

## 2016-08-31 ENCOUNTER — Other Ambulatory Visit: Payer: Self-pay | Admitting: Internal Medicine

## 2016-11-16 IMAGING — CR DG CHEST 2V
2 series · 2 of 2 positions shown · non-contrast
Comparison: None.

CLINICAL DATA: Cough and fever.  Altered mental status.

EXAM:
CHEST  2 VIEW

[x chest ap]
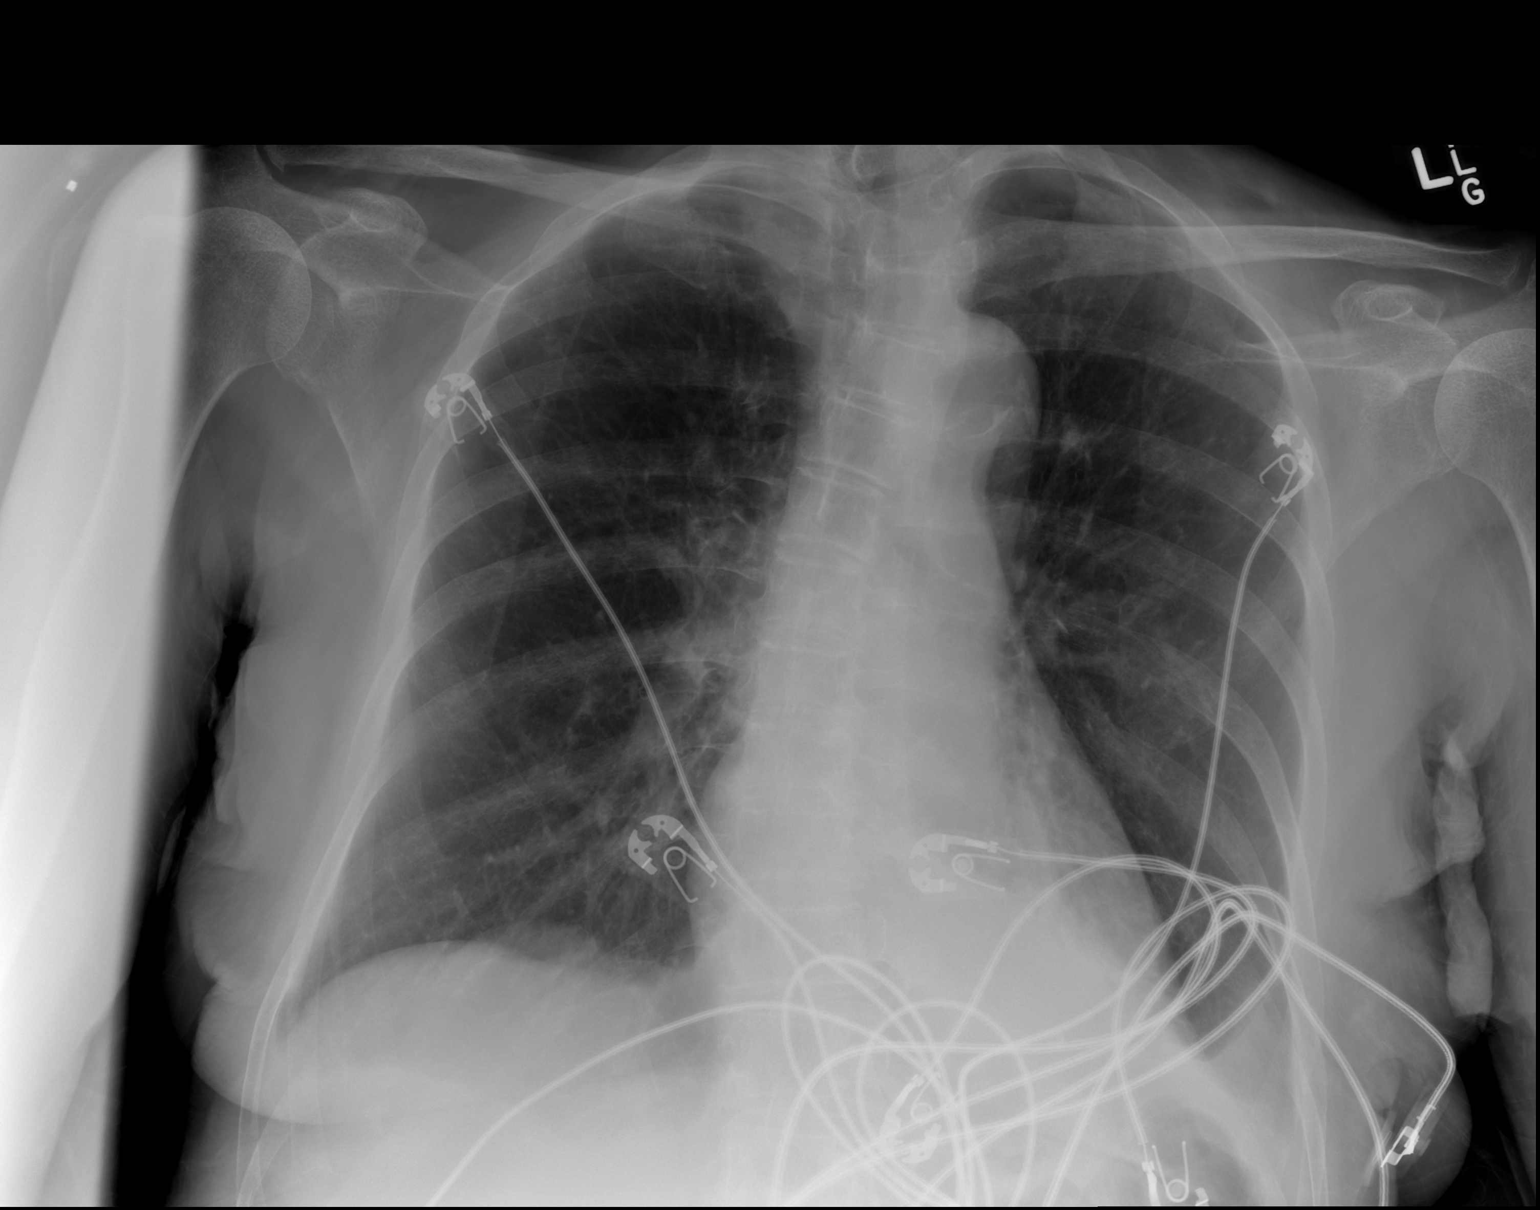

[w chest lat]
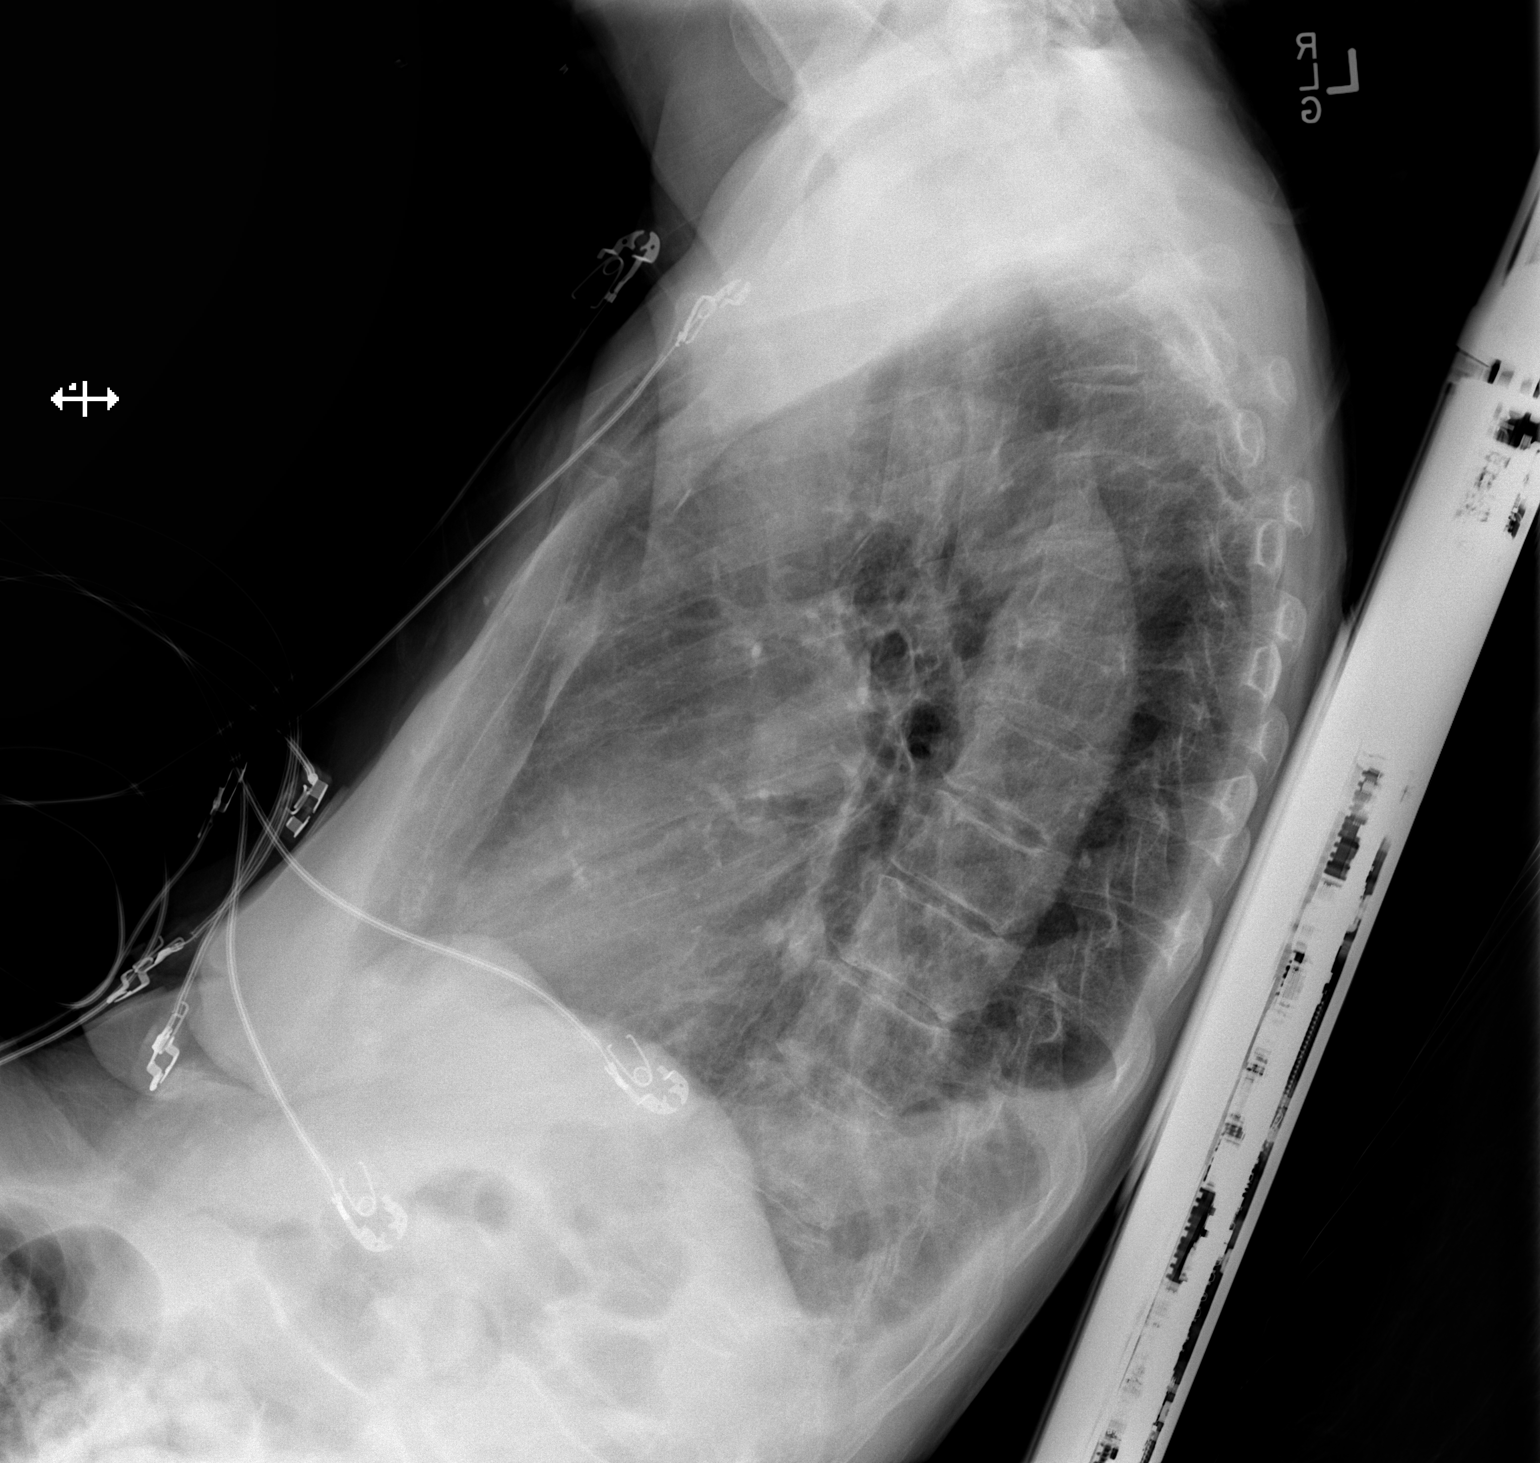

[2 of 2 positions shown; findings below may reference images not displayed]

FINDINGS: There is airspace consolidation in the posterior left base. Lungs
elsewhere clear. Heart size and pulmonary vascularity are normal. No
adenopathy. There is upper thoracic levoscoliosis.
IMPRESSION: Airspace consolidation consistent with pneumonia, posterior left
base. Followup PA and lateral chest radiographs recommended in 3-4
weeks following trial of antibiotic therapy to ensure resolution and
exclude underlying malignancy.

## 2016-11-16 IMAGING — CT CT HEAD W/O CM
2 series · 15 of 30 positions shown, 19 images · non-contrast
Comparison: None.

CLINICAL DATA: Altered mental status

EXAM:
CT HEAD WITHOUT CONTRAST
TECHNIQUE: Contiguous axial images were obtained from the base of the skull
through the vertex without intravenous contrast.

[Series 2: head w/o · axial · non-contrast · 0.45mm/px · z∈[-129,-9]mm · 13 of 30 slices shown, 17 images]
[im 3/30  brain]
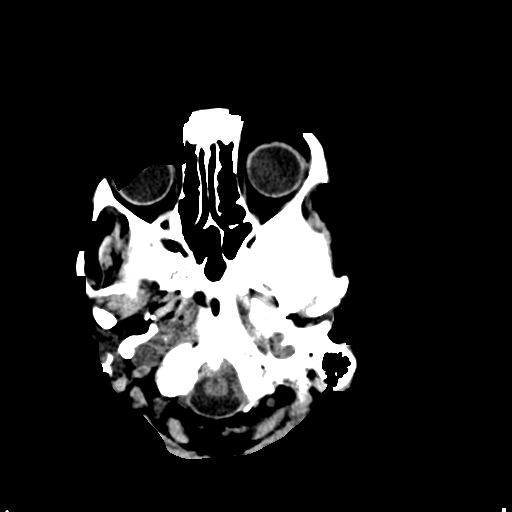
[im 3/30  bone]
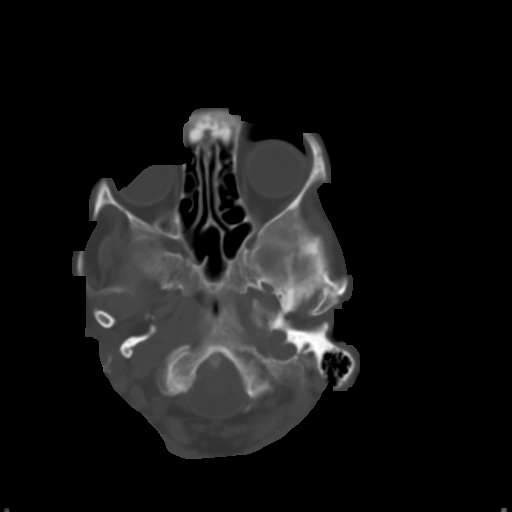
[im 5/30  brain]
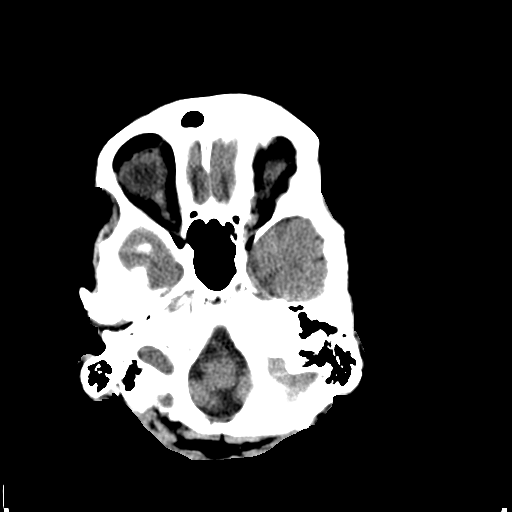
[im 7/30  brain]
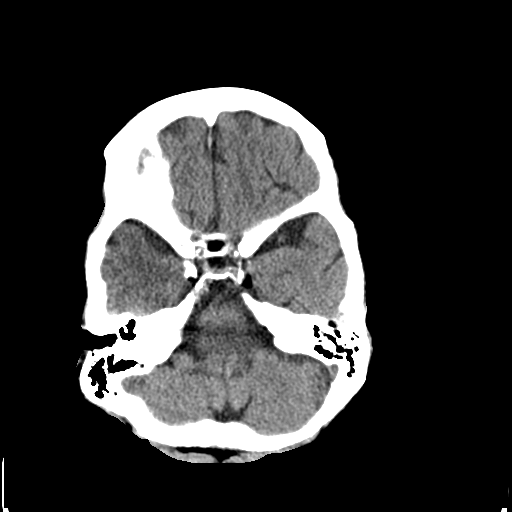
[im 9/30  brain]
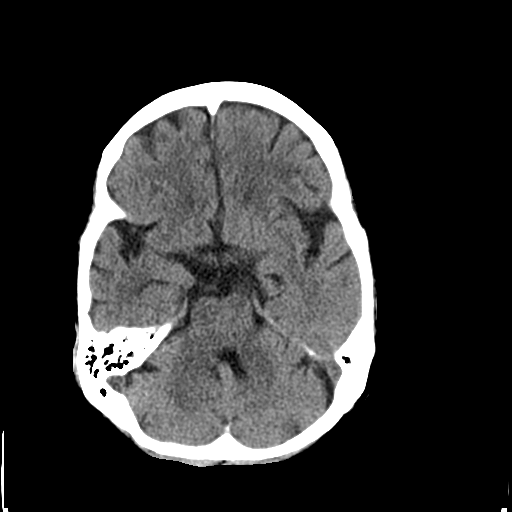
[im 11/30  brain]
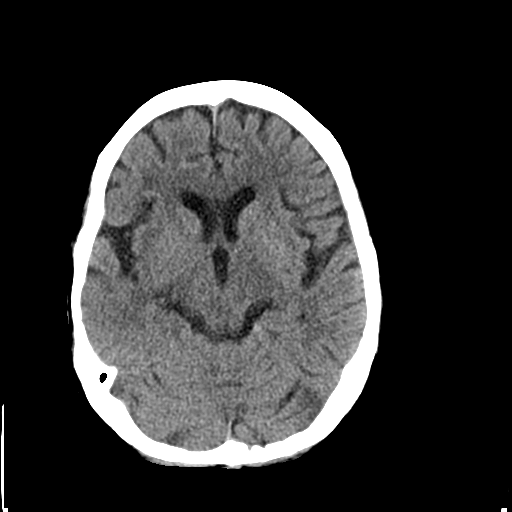
[im 11/30  bone]
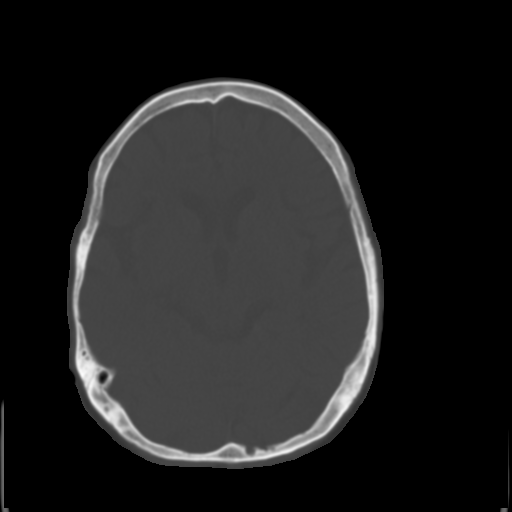
[im 13/30  brain]
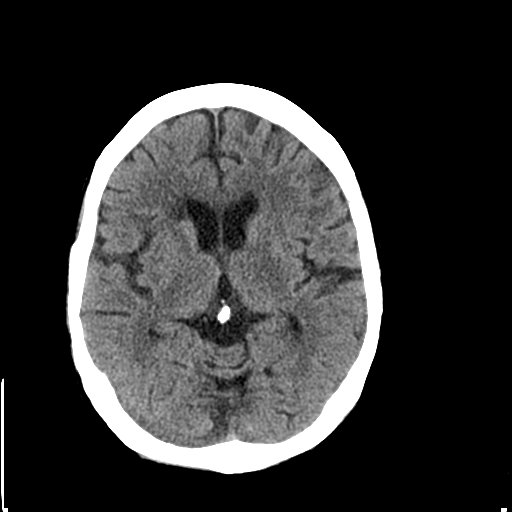
[im 15/30  brain]
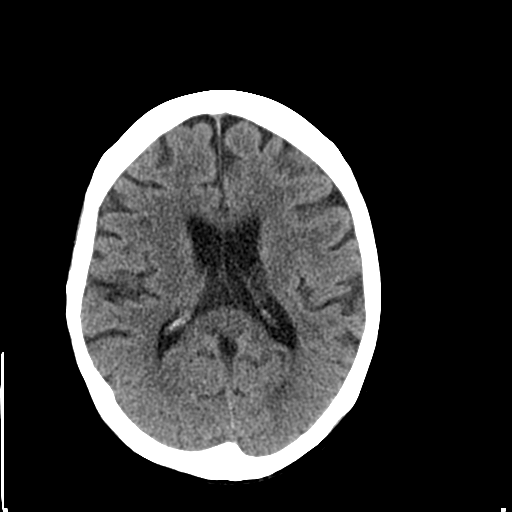
[im 17/30  brain]
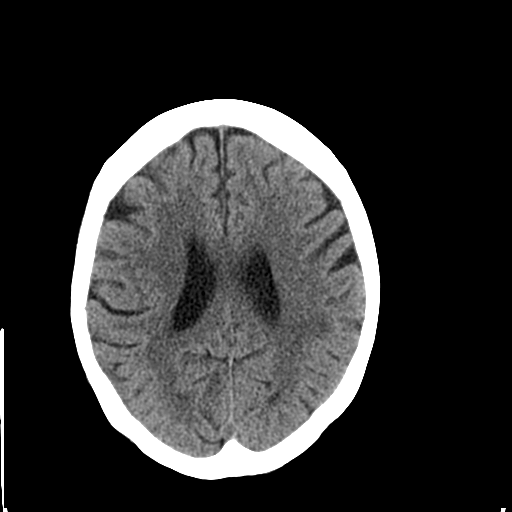
[im 19/30  brain]
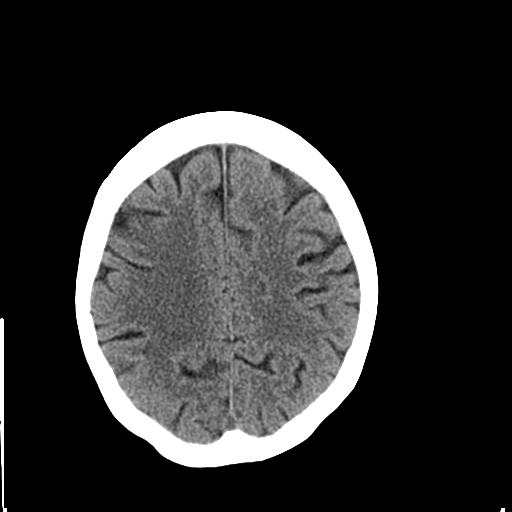
[im 19/30  bone]
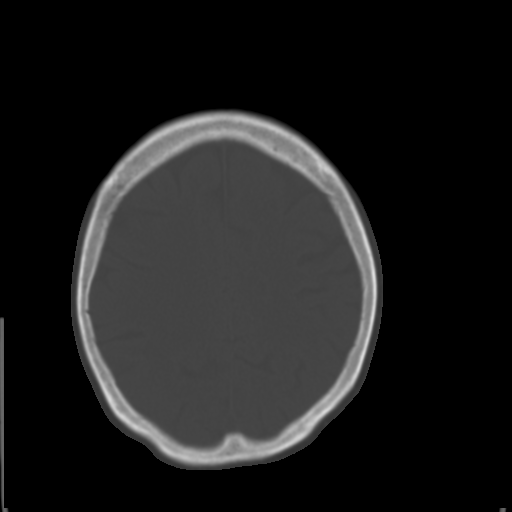
[im 21/30  brain]
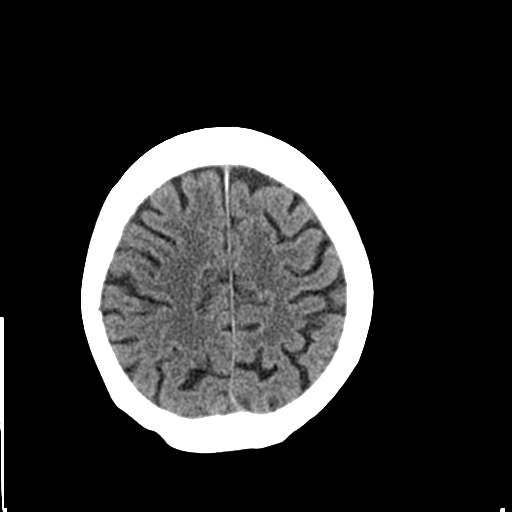
[im 23/30  brain]
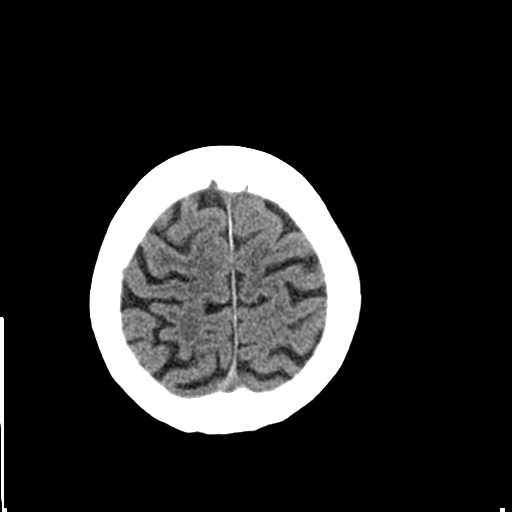
[im 25/30  brain]
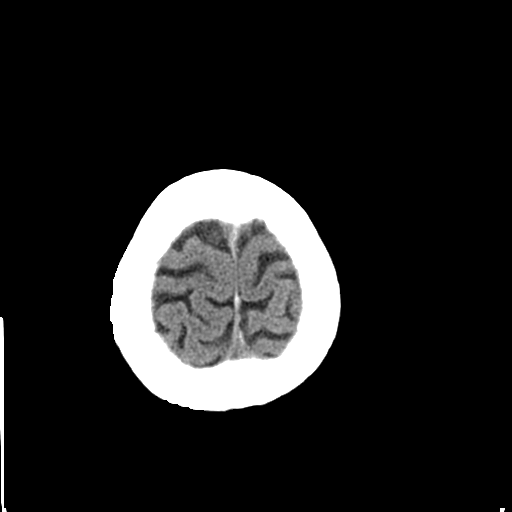
[im 27/30  brain]
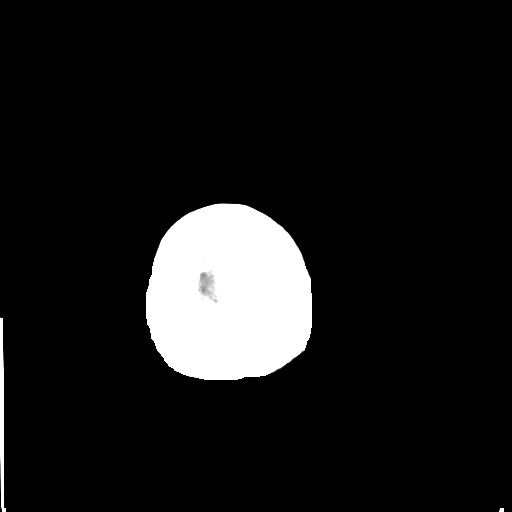
[im 27/30  bone]
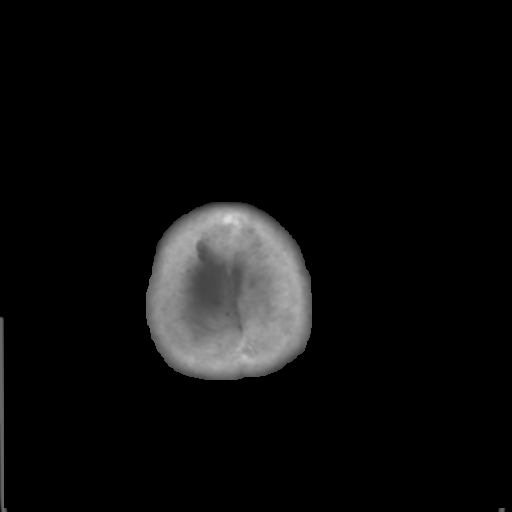

[Series 3: bone windows · axial · 0.45mm/px · z∈[-129,-109]mm · 2 of 30 slices shown]
[im 3/30  bone]
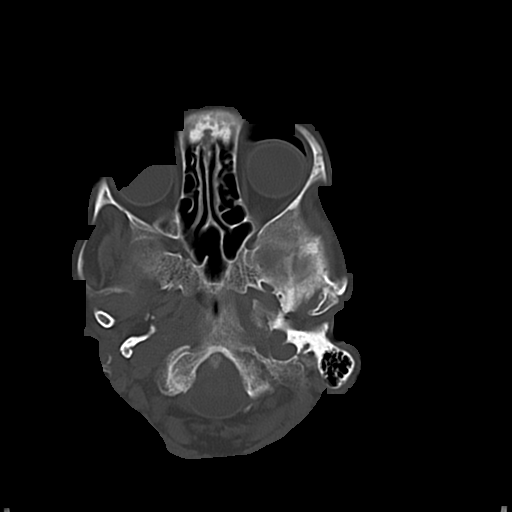
[im 7/30  bone]
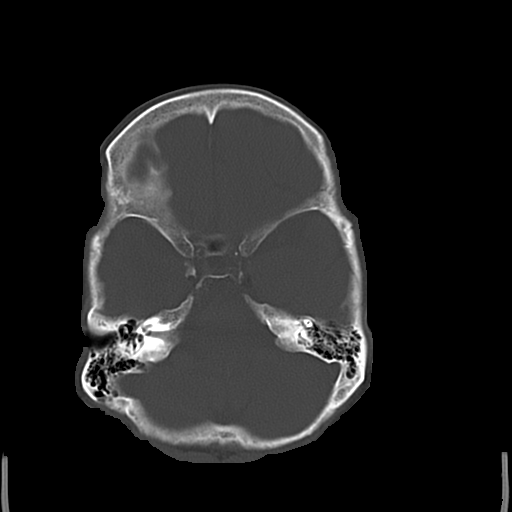

[15 of 30 positions shown; findings below may reference images not displayed]

FINDINGS: There is age related volume loss. There is no intracranial mass
hemorrhage, extra-axial fluid collection, or midline shift. There is
a prior focal infarct in the anterior limb of the right internal
capsule. There is slight small vessel disease in the centra
semiovale bilaterally. On axial slice 14 series 2, there is a small
focus of decreased attenuation at the gray - white compartment
junction of the right frontal -temporal lobe. This area may
represent a small acute infarct. Gray-white compartments elsewhere
appear normal. The bony calvarium appears intact. The mastoid air
cells are clear.
IMPRESSION: Suspect small early infarct at the gray-white junction of the right
posterior frontal -anterior temporal junction. Slight
periventricular small vessel disease. Prior small infarct anterior
limb right internal capsule. No hemorrhage or mass effect.

## 2016-11-19 IMAGING — DX DG CHEST 2V
2 series · 2 of 2 positions shown · non-contrast
Comparison: 03/02/2015

CLINICAL DATA: 84-year-old female with a history of pneumonia

EXAM:
CHEST - 2 VIEW

[chest pa]
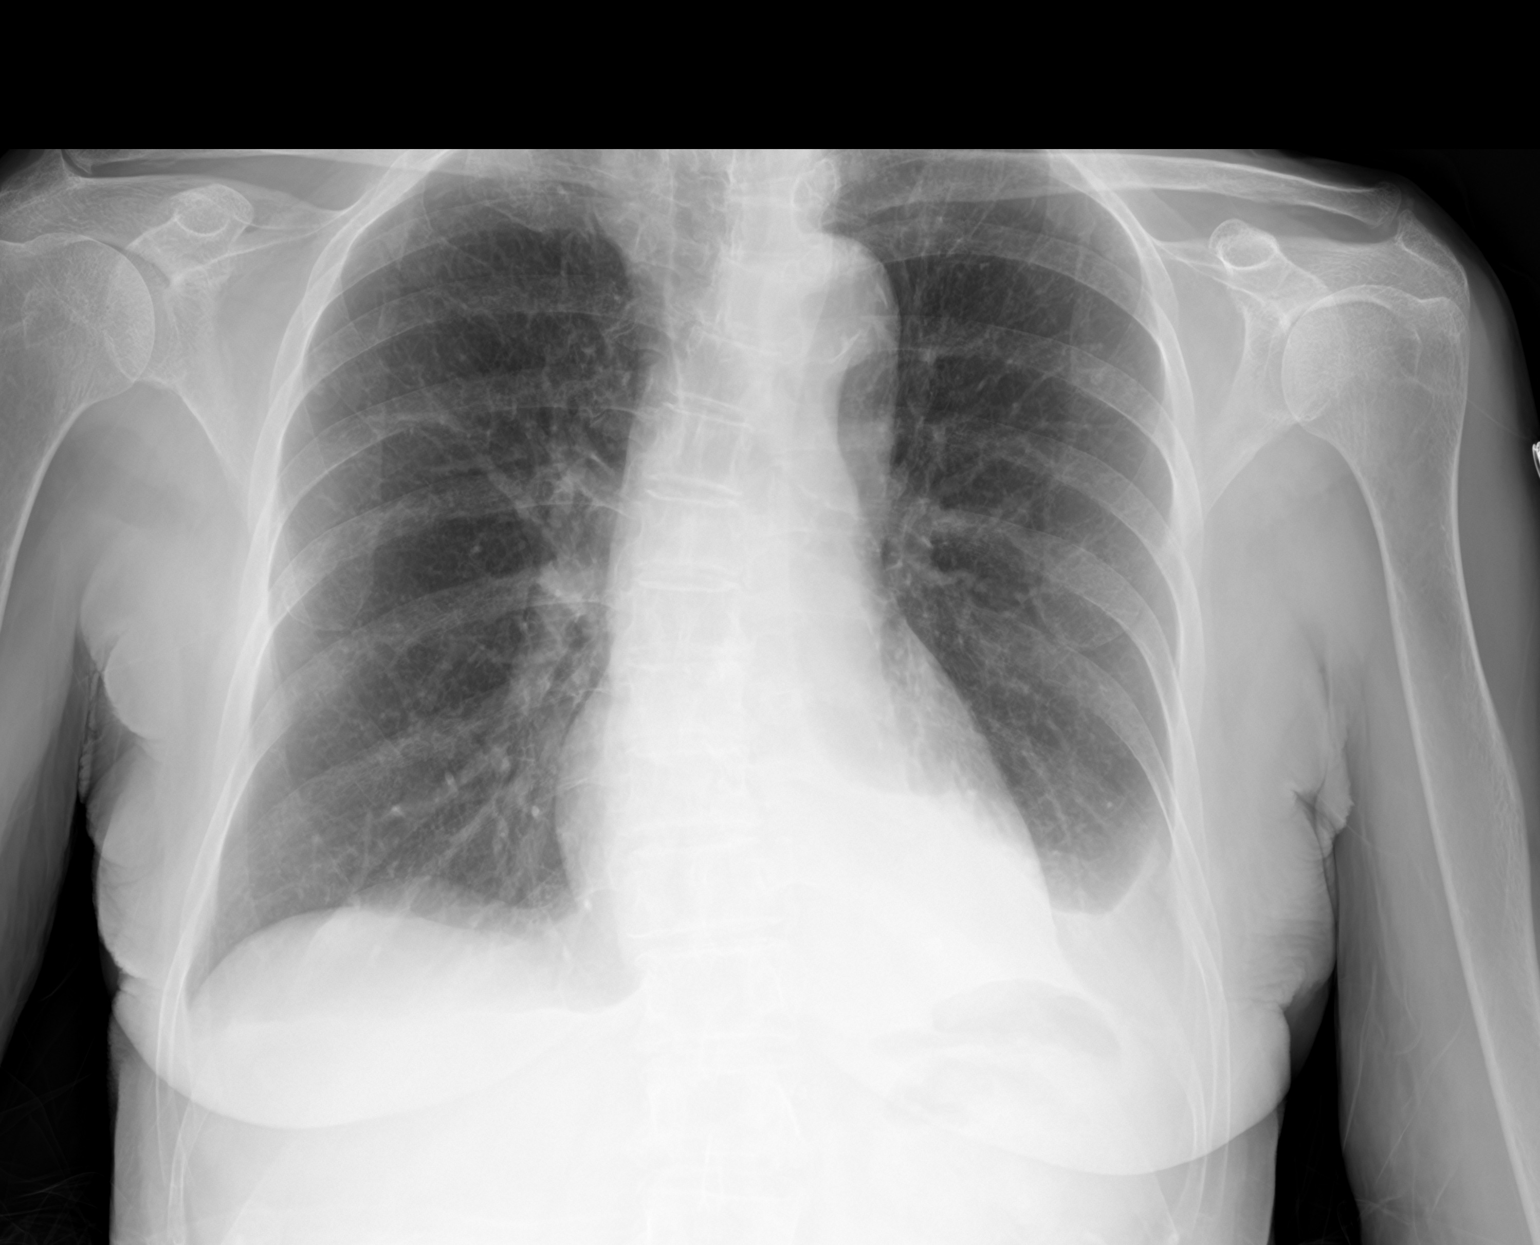

[chest lat]
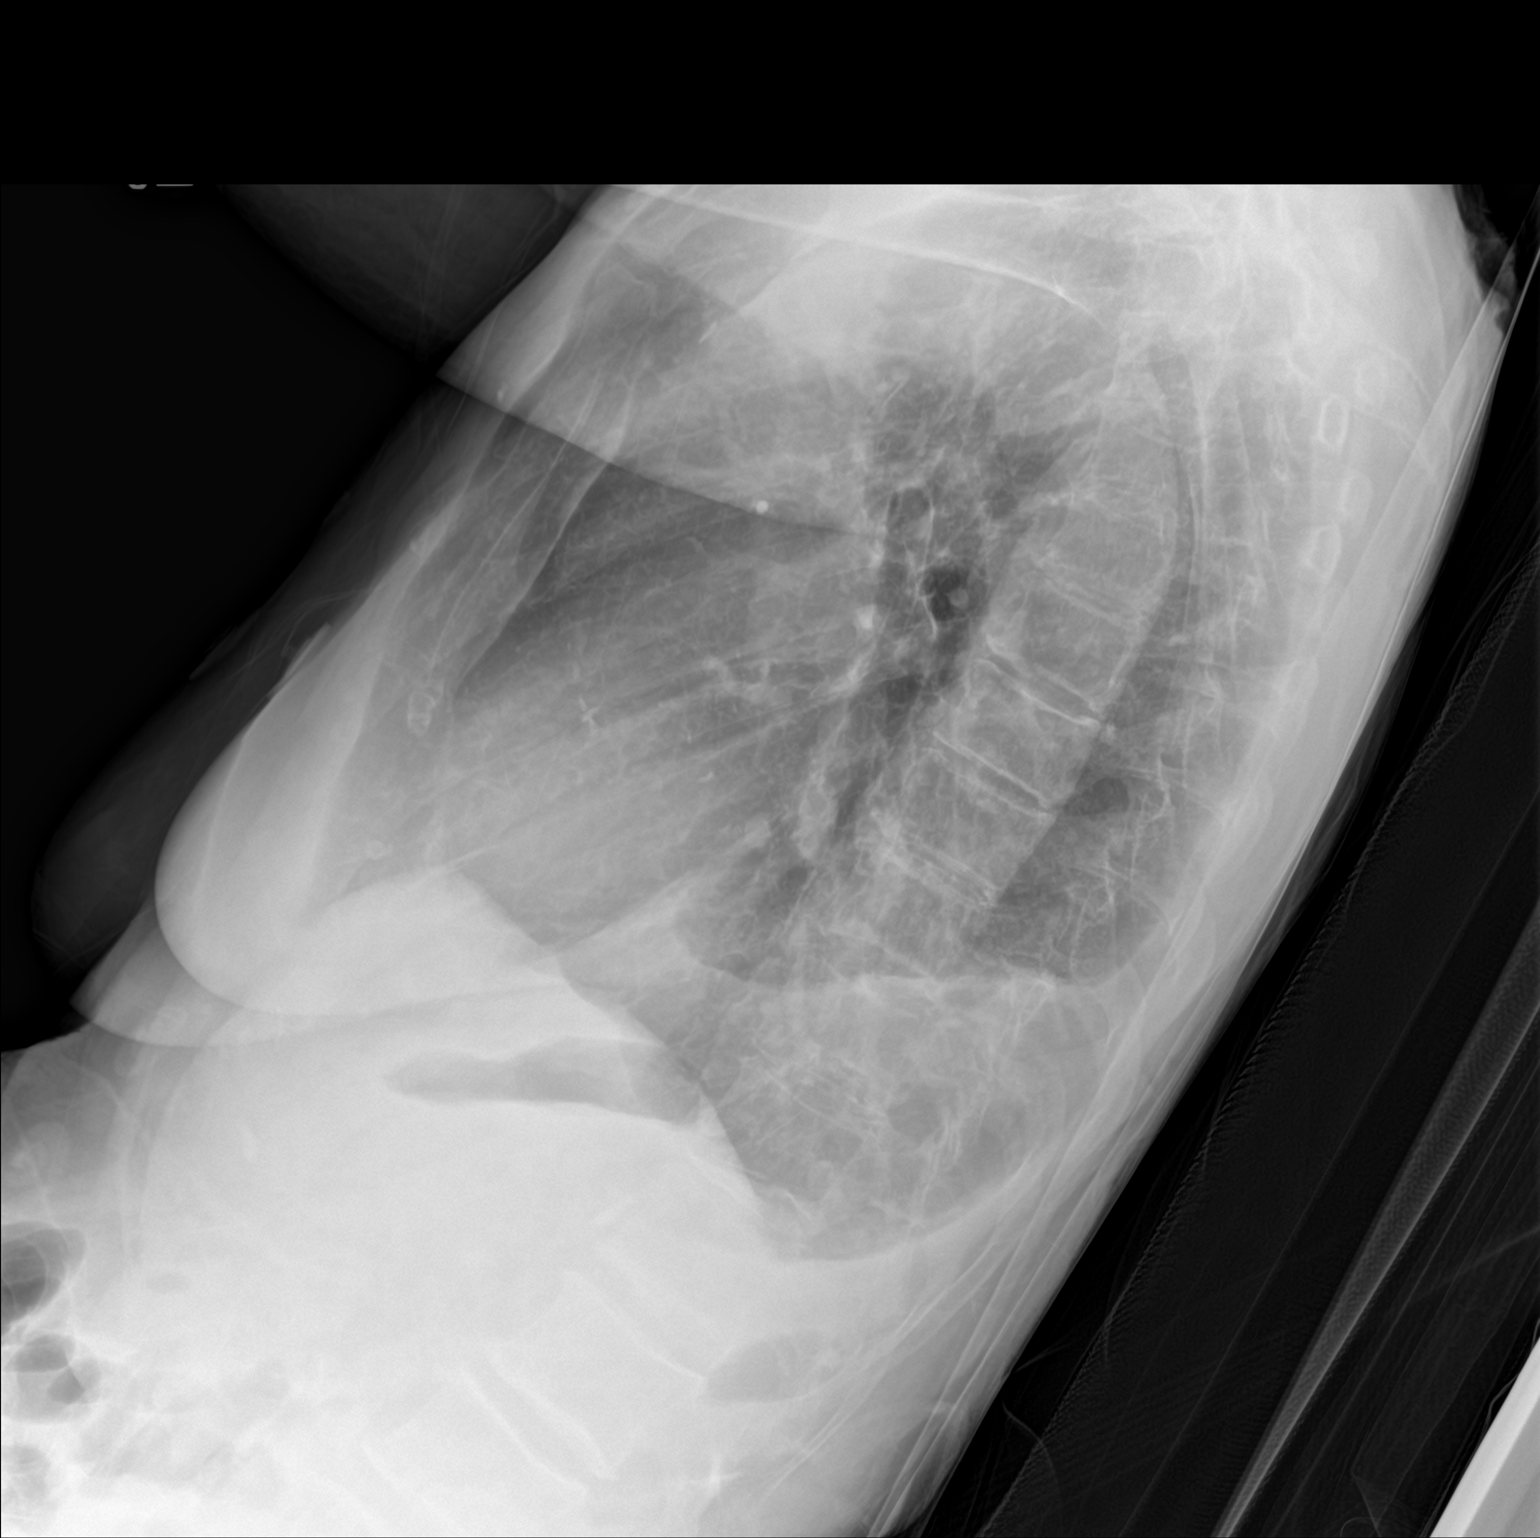

[2 of 2 positions shown; findings below may reference images not displayed]

FINDINGS: Cardiomediastinal silhouette unchanged.

No evidence of pulmonary vascular congestion.

Atherosclerotic calcifications aortic arch.

Increasing opacity at the left base with blunting of the left
costophrenic angle and blunting on the lateral view the costophrenic
sulcus.

Right lung relatively well aerated.

No displaced fracture.

Unremarkable appearance of the upper abdomen.
IMPRESSION: Persisting small the moderate left-sided pleural effusion with
associated atelectasis. Persisting small right pleural effusion.

Atherosclerosis

## 2016-11-29 ENCOUNTER — Other Ambulatory Visit: Payer: Self-pay | Admitting: Internal Medicine

## 2016-12-25 ENCOUNTER — Other Ambulatory Visit: Payer: Self-pay | Admitting: Internal Medicine

## 2017-03-31 NOTE — Progress Notes (Signed)
6 month follow up and CPE  Assessment:    Chronic diastolic heart failure (HCC) -     EKG 12-Lead - continue BP med, tolerating well, monitor BP/weight  Bilateral carotid artery stenosis Continue BP medication  Anemia, unspecified type -     Iron,Total/Total Iron Binding Cap -     Ferritin  Elevated TSH -     TSH  CKD (chronic kidney disease), stage III (HCC) -     BASIC METABOLIC PANEL WITH GFR -     Microalbumin / creatinine urine ratio  History of gastric ulcer No bleeding at this time, still on protonix Had demand ischemia with this but otherwise walking, no issues.   Needs flu shot -     Flu vaccine HIGH DOSE PF  Medication management -     CBC with Differential/Platelet -     Hepatic function panel -     Magnesium  Screening cholesterol level -     Lipid panel  Mild malnutrition Start on ensure/boost  Routine general medical examination at a health care facility Declines vaccines and preventative exams, understands risk of cancer, death, etc but patient states she would not have treatment if found so declines testing, we will follow her wishes.    Over 40 minutes of exam, counseling, chart review and critical decision making was performed  Future Appointments  Date Time Provider Department Center  04/09/2018  2:00 PM [name], PA-C [facility] None     Subjective:  Erica Owen is a 83 y.o. female who presents for CPE and follow up for anemia.   Her blood pressure has been controlled at home, today their BP is BP: 116/74 She does workout, walks alot. She denies chest pain, shortness of breath, no dizziness.  She has a history of upper GI bleed in Nov 2016, had demand ischemia, declined colonoscopy at that time.  Has aversion to doctors and medicine.  She is not on cholesterol medication and denies myalgias. Her cholesterol is at goal. The cholesterol last visit was:   Lab Results  Component Value Date   CHOL 134 03/03/2015   HDL 28 (L)  03/03/2015   LDLCALC 76 03/03/2015   TRIG 149 03/03/2015   CHOLHDL 4.8 03/03/2015   . Last A1C in the office was:  Lab Results  Component Value Date   HGBA1C 5.3 08/23/2016   BMI is Body mass index is 18.95 kg/m., she is working on diet and exercise. Has been walking more, eating well. Has not been drinking ensure/boost.  Wt Readings from Last 3 Encounters:  04/01/17 107 lb (48.5 kg)  08/23/16 113 lb 3.2 oz (51.3 kg)  02/23/16 117 lb 3.2 oz (53.2 kg)    Medication Review: Current Outpatient Medications on File Prior to Visit  Medication Sig Dispense Refill  . acetaminophen (TYLENOL) 500 MG tablet Take 500 mg by mouth every 6 (six) hours as needed for mild pain.    . bisoprolol-hydrochlorothiazide (ZIAC) 5-6.25 MG tablet TAKE ONE TABLET BY MOUTH DAILY 90 tablet 1  . pantoprazole (PROTONIX) 40 MG tablet TAKE ONE TABLET BY MOUTH DAILY 90 tablet 1  . VITAMIN D, CHOLECALCIFEROL, PO Take by mouth.     No current facility-administered medications on file prior to visit.     Current Problems (verified) Patient Active Problem List   Diagnosis Date Noted  . Chronic diastolic heart failure (HCC) 02/23/2016  . Carotid artery stenosis 03/06/2015  . CKD (chronic kidney disease), stage III (HCC) 03/06/2015  . Gastritis  03/06/2015  . Demand ischemia (HCC) 03/05/2015  . Elevated TSH 03/05/2015  . History of gastric ulcer   . Absolute anemia     Screening Tests Preventative care: Last colonoscopy: declines Last mammogram: declines Last pap smear/pelvic exam: declines  DEXA: declines  EGD 03/2015 CT head 2016 CXR 03/2015 Echo 03/2015 55-60%, grade 1 diastolic  Prior vaccinations: TD or Tdap: declines  Influenza: 2018 Got flu shot Pneumococcal: declines Prevnar13: declines Shingles/Zostavax: declines  Names of Other Physician/Practitioners you currently use: 1. [facility] here for primary care 2. None, eye doctor, last visit  declines 3. None, dentist, has dentures.  Dr. Rhea BeltonPyrtle Patient Care Team: Lucky CowboyMcKeown, William, MD as PCP - General (Internal Medicine)  Allergies No Known Allergies  SURGICAL HISTORY She  has a past surgical history that includes Abdominal hysterectomy and Esophagogastroduodenoscopy (egd) with propofol (N/A, 03/03/2015). FAMILY HISTORY Her family history includes Colon cancer in her brother; Heart attack in her father; Kidney failure in her mother; Lupus in her mother; Pancreatic cancer in her sister. SOCIAL HISTORY She  reports that she quit smoking about 52 years ago. Her smoking use included cigarettes. she has never used smokeless tobacco. She reports that she does not drink alcohol or use drugs.   Review of Systems  Constitutional: Negative.   HENT: Negative.   Eyes: Negative.   Respiratory: Negative.   Cardiovascular: Negative.   Gastrointestinal: Negative.   Genitourinary: Negative.   Musculoskeletal: Negative.   Skin: Negative.   Neurological: Negative.   Endo/Heme/Allergies: Negative.   Psychiatric/Behavioral: Negative.      Objective:     Today's Vitals   04/01/17 1348  BP: 116/74  Pulse: 64  Resp: 14  Temp: 97.7 F (36.5 C)  SpO2: 96%  Weight: 107 lb (48.5 kg)  Height: 5\' 3"  (1.6 m)   Body mass index is 18.95 kg/m.  General appearance: alert, no distress, WD/WN, female HEENT: normocephalic, sclerae anicteric, TMs pearly, nares patent, no discharge or erythema, pharynx normal Oral cavity: MMM, no lesions Neck: supple, no lymphadenopathy, no thyromegaly, no masses Heart: RRR, normal S1, S2, no murmurs Lungs: CTA bilaterally, no wheezes, rhonchi, or rales Abdomen: +bs, soft, non tender, non distended, no masses, no hepatomegaly, no splenomegaly Musculoskeletal: nontender, no swelling, no obvious deformity Extremities: no edema, no cyanosis, no clubbing Pulses: 2+ symmetric, upper and lower extremities, normal cap refill Neurological: alert, oriented x 3,  CN2-12 intact, strength normal upper extremities and lower extremities, sensation normal throughout, DTRs 2+ throughout, no cerebellar signs, gait normal Psychiatric: normal affect, behavior normal, pleasant    Quentin Mullingmanda Sylvanna Burggraf, PA-C   04/01/2017

## 2017-04-01 ENCOUNTER — Encounter: Payer: Self-pay | Admitting: Physician Assistant

## 2017-04-01 ENCOUNTER — Ambulatory Visit: Payer: Medicare Other | Admitting: Physician Assistant

## 2017-04-01 VITALS — BP 116/74 | HR 64 | Temp 97.7°F | Resp 14 | Ht 63.0 in | Wt 107.0 lb

## 2017-04-01 DIAGNOSIS — I10 Essential (primary) hypertension: Secondary | ICD-10-CM | POA: Diagnosis not present

## 2017-04-01 DIAGNOSIS — I6523 Occlusion and stenosis of bilateral carotid arteries: Secondary | ICD-10-CM

## 2017-04-01 DIAGNOSIS — Z0000 Encounter for general adult medical examination without abnormal findings: Secondary | ICD-10-CM

## 2017-04-01 DIAGNOSIS — Z79899 Other long term (current) drug therapy: Secondary | ICD-10-CM

## 2017-04-01 DIAGNOSIS — Z13220 Encounter for screening for lipoid disorders: Secondary | ICD-10-CM

## 2017-04-01 DIAGNOSIS — N183 Chronic kidney disease, stage 3 unspecified: Secondary | ICD-10-CM

## 2017-04-01 DIAGNOSIS — E441 Mild protein-calorie malnutrition: Secondary | ICD-10-CM | POA: Insufficient documentation

## 2017-04-01 DIAGNOSIS — D649 Anemia, unspecified: Secondary | ICD-10-CM

## 2017-04-01 DIAGNOSIS — Z136 Encounter for screening for cardiovascular disorders: Secondary | ICD-10-CM

## 2017-04-01 DIAGNOSIS — R7989 Other specified abnormal findings of blood chemistry: Secondary | ICD-10-CM

## 2017-04-01 DIAGNOSIS — Z8711 Personal history of peptic ulcer disease: Secondary | ICD-10-CM

## 2017-04-01 DIAGNOSIS — Z8719 Personal history of other diseases of the digestive system: Secondary | ICD-10-CM

## 2017-04-01 DIAGNOSIS — Z23 Encounter for immunization: Secondary | ICD-10-CM | POA: Diagnosis not present

## 2017-04-01 DIAGNOSIS — I5032 Chronic diastolic (congestive) heart failure: Secondary | ICD-10-CM

## 2017-04-01 NOTE — Patient Instructions (Signed)
Start on ensure/boost Continue protonix and BP med Monitor BP at home

## 2017-04-02 LAB — CBC WITH DIFFERENTIAL/PLATELET
BASOS ABS: 20 {cells}/uL (ref 0–200)
Basophils Relative: 0.3 %
EOS PCT: 0.4 %
Eosinophils Absolute: 27 cells/uL (ref 15–500)
HEMATOCRIT: 36.3 % (ref 35.0–45.0)
Hemoglobin: 12.3 g/dL (ref 11.7–15.5)
Lymphs Abs: 1578 cells/uL (ref 850–3900)
MCH: 30.1 pg (ref 27.0–33.0)
MCHC: 33.9 g/dL (ref 32.0–36.0)
MCV: 89 fL (ref 80.0–100.0)
MONOS PCT: 10.3 %
MPV: 10.9 fL (ref 7.5–12.5)
NEUTROS PCT: 65.8 %
Neutro Abs: 4474 cells/uL (ref 1500–7800)
Platelets: 244 10*3/uL (ref 140–400)
RBC: 4.08 10*6/uL (ref 3.80–5.10)
RDW: 12.3 % (ref 11.0–15.0)
Total Lymphocyte: 23.2 %
WBC mixed population: 700 cells/uL (ref 200–950)
WBC: 6.8 10*3/uL (ref 3.8–10.8)

## 2017-04-02 LAB — LIPID PANEL
CHOL/HDL RATIO: 3.7 (calc) (ref <5.0)
Cholesterol: 206 mg/dL — ABNORMAL HIGH (ref <200)
HDL: 55 mg/dL (ref >50)
LDL Cholesterol (Calc): 129 mg/dL (calc) — ABNORMAL HIGH
NON-HDL CHOLESTEROL (CALC): 151 mg/dL — AB (ref <130)
Triglycerides: 109 mg/dL (ref <150)

## 2017-04-02 LAB — MICROALBUMIN / CREATININE URINE RATIO
Creatinine, Urine: 39 mg/dL (ref 20–275)
MICROALB UR: 0.6 mg/dL
Microalb Creat Ratio: 15 mcg/mg creat (ref <30)

## 2017-04-02 LAB — HEPATIC FUNCTION PANEL
AG RATIO: 1.5 (calc) (ref 1.0–2.5)
ALKALINE PHOSPHATASE (APISO): 102 U/L (ref 33–130)
ALT: 17 U/L (ref 6–29)
AST: 19 U/L (ref 10–35)
Albumin: 4.4 g/dL (ref 3.6–5.1)
BILIRUBIN DIRECT: 0.2 mg/dL (ref 0.0–0.2)
BILIRUBIN TOTAL: 1 mg/dL (ref 0.2–1.2)
Globulin: 2.9 g/dL (calc) (ref 1.9–3.7)
Indirect Bilirubin: 0.8 mg/dL (calc) (ref 0.2–1.2)
Total Protein: 7.3 g/dL (ref 6.1–8.1)

## 2017-04-02 LAB — FERRITIN: FERRITIN: 795 ng/mL — AB (ref 20–288)

## 2017-04-02 LAB — BASIC METABOLIC PANEL WITH GFR
BUN / CREAT RATIO: 21 (calc) (ref 6–22)
BUN: 19 mg/dL (ref 7–25)
CO2: 32 mmol/L (ref 20–32)
CREATININE: 0.92 mg/dL — AB (ref 0.60–0.88)
Calcium: 10.9 mg/dL — ABNORMAL HIGH (ref 8.6–10.4)
Chloride: 100 mmol/L (ref 98–110)
GFR, EST AFRICAN AMERICAN: 66 mL/min/{1.73_m2} (ref >=60)
GFR, EST NON AFRICAN AMERICAN: 57 mL/min/{1.73_m2} — AB (ref >=60)
Glucose, Bld: 91 mg/dL (ref 65–99)
Potassium: 4.6 mmol/L (ref 3.5–5.3)
SODIUM: 141 mmol/L (ref 135–146)

## 2017-04-02 LAB — IRON, TOTAL/TOTAL IRON BINDING CAP
%SAT: 17 % (ref 11–50)
Iron: 40 ug/dL — ABNORMAL LOW (ref 45–160)
TIBC: 234 ug/dL — AB (ref 250–450)

## 2017-04-02 LAB — MAGNESIUM: Magnesium: 2 mg/dL (ref 1.5–2.5)

## 2017-04-02 LAB — TSH: TSH: 5.14 mIU/L — ABNORMAL HIGH (ref 0.40–4.50)

## 2017-04-15 NOTE — Progress Notes (Signed)
Labs mailed out to pt 

## 2017-05-29 ENCOUNTER — Other Ambulatory Visit: Payer: Self-pay | Admitting: Internal Medicine

## 2017-06-07 ENCOUNTER — Other Ambulatory Visit: Payer: Self-pay | Admitting: *Deleted

## 2017-06-07 MED ORDER — ATENOLOL 50 MG PO TABS: 50.0000 mg | ORAL_TABLET | Freq: Every day | ORAL | 1 refills | Status: DC

## 2017-06-17 ENCOUNTER — Other Ambulatory Visit: Payer: Self-pay | Admitting: Internal Medicine

## 2017-12-12 ENCOUNTER — Other Ambulatory Visit: Payer: Self-pay | Admitting: Internal Medicine

## 2017-12-16 ENCOUNTER — Other Ambulatory Visit: Payer: Self-pay | Admitting: Internal Medicine

## 2018-01-30 ENCOUNTER — Ambulatory Visit: Payer: Self-pay | Admitting: Physician Assistant

## 2018-02-09 NOTE — Progress Notes (Signed)
Follow up and WELLNESS  Assessment:    Chronic diastolic heart failure (HCC) - continue BP med, tolerating well, monitor BP/weight  Bilateral carotid artery stenosis Continue BP medication  Anemia, unspecified type -     Iron,Total/Total Iron Binding Cap -     Ferritin  Elevated TSH -     TSH  CKD (chronic kidney disease), stage III (HCC) -     BASIC METABOLIC PANEL WITH GFR -     Microalbumin / creatinine urine ratio  History of gastric ulcer No bleeding at this time, still on protonix Had demand ischemia with this but otherwise walking, no issues.   Needs flu shot -     Flu vaccine HIGH DOSE PF  Medication management -     CBC with Differential/Platelet -     Hepatic function panel -     Magnesium  Mild malnutrition Start on ensure/boost   Over 40 minutes of exam, counseling, chart review and critical decision making was performed  Future Appointments  Date Time Provider Department Center  02/10/2018  3:30 PM [name], PA-C [facility] None  04/09/2018  2:00 PM [name], PA-C [facility] None    Plan:   During the course of the visit the patient was educated and counseled about appropriate screening and preventive services including:    Pneumococcal vaccine   Prevnar 13  Influenza vaccine  Td vaccine  Screening electrocardiogram  Bone densitometry screening  Colorectal cancer screening  Diabetes screening  Glaucoma screening  Nutrition counseling   Advanced directives: requested    Subjective:  Erica Owen is a 84 y.o. female who presents for wellness and follow up for anemia.  Had CT head 03/2015 IMPRESSION: Suspect small early infarct at the gray-white junction of the right posterior frontal -anterior temporal junction. Slight periventricular small vessel disease. 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 No hemorrhage or mass effect.   Her blood pressure has been controlled at home, today their BP  is BP: 138/90 She does workout, walks alot. She denies chest pain, shortness of breath, no dizziness.  She has a history of upper GI bleed in Nov 2016, had demand ischemia, declined colonoscopy at that time.  Has aversion to doctors and medicine and still does not want any medications.  She is not on cholesterol medication and denies myalgias. Her cholesterol is at goal. The cholesterol last visit was:   Lab Results  Component Value Date   CHOL 206 (H) 04/01/2017   HDL 55 04/01/2017   LDLCALC 129 (H) 04/01/2017   TRIG 109 04/01/2017   CHOLHDL 3.7 04/01/2017   . Last A1C in the office was:  Lab Results  Component Value Date   HGBA1C 5.3 08/23/2016   BMI is Body mass index is 17.89 kg/m., she is working on diet and exercise. She is still active, not doing ensure anymore, weight has been going down. Has no appetite.  Wt Readings from Last 3 Encounters:  02/10/18 101 lb (45.8 kg)  04/01/17 107 lb (48.5 kg)  08/23/16 113 lb 3.2 oz (51.3 kg)    Medication Review: Current Outpatient Medications on File Prior to Visit  Medication Sig Dispense Refill  . acetaminophen (TYLENOL) 500 MG tablet Take 500 mg by mouth every 6 (six) hours as needed for mild pain.    Marland Kitchen atenolol (TENORMIN) 50 MG tablet TAKE ONE TABLET BY MOUTH DAILY 90 tablet 1  . pantoprazole (PROTONIX) 40 MG tablet TAKE ONE TABLET BY MOUTH DAILY 90 tablet 0  .  VITAMIN D, CHOLECALCIFEROL, PO Take by mouth.     No current facility-administered medications on file prior to visit.     Current Problems (verified) Patient Active Problem List   Diagnosis Date Noted  . Mild malnutrition (HCC) 04/01/2017  . Chronic diastolic heart failure (HCC) 02/23/2016  . Carotid artery stenosis 03/06/2015  . CKD (chronic kidney disease), stage III (HCC) 03/06/2015  . Gastritis 03/06/2015  . Demand ischemia (HCC) 03/05/2015  . Elevated TSH 03/05/2015  . History of gastric ulcer   . Absolute anemia     Screening Tests Preventative  care: Last colonoscopy: declines Last mammogram: declines Last pap smear/pelvic exam: declines  DEXA: declines  EGD 03/2015 CT head 2016 CXR 03/2015 Echo 03/2015 55-60%, grade 1 diastolic  Prior vaccinations: TD or Tdap: declines  Influenza: Needs Pneumococcal: declines Prevnar13: declines Shingles/Zostavax: declines  Names of Other Physician/Practitioners you currently use: 1. [facility] here for primary care 2. None, eye doctor, last visit declines 3. None, dentist, has dentures.  Dr. Rhea Belton Patient Care Team: Lucky Cowboy, MD as PCP - General (Internal Medicine)  Allergies No Known Allergies  SURGICAL HISTORY She  has a past surgical history that includes Abdominal hysterectomy and Esophagogastroduodenoscopy (egd) with propofol (N/A, 03/03/2015). FAMILY HISTORY Her family history includes Colon cancer in her brother; Heart attack in her father; Kidney failure in her mother; Lupus in her mother; Pancreatic cancer in her sister. SOCIAL HISTORY She  reports that she quit smoking about 52 years ago. Her smoking use included cigarettes. She has never used smokeless tobacco. She reports that she does not drink alcohol or use drugs.  MEDICARE WELLNESS OBJECTIVES: Physical activity:   Cardiac risk factors:   Depression/mood screen:   Depression screen [facility] 2/9 08/23/2016  Decreased Interest 0  Down, Depressed, Hopeless 0  PHQ - 2 Score 0    ADLs:  No flowsheet data found.   Cognitive Testing  Alert? Yes  Normal Appearance?Yes  Oriented to person? Yes  Place? Yes   Time? Yes  Recall of three objects?  2/3  Can perform simple calculations? Yes  Displays appropriate judgment?Yes  Can read the correct time from a watch face?Yes  EOL planning: Does Patient Have a Medical Advance Directive?: No Would patient like information on creating a medical advance directive?: Yes (MAU/Ambulatory/Procedural Areas - Information  given)    Review of Systems  Constitutional: Negative.   HENT: Negative.   Eyes: Negative.   Respiratory: Negative.   Cardiovascular: Negative.   Gastrointestinal: Negative.   Genitourinary: Negative.   Musculoskeletal: Negative.   Skin: Negative.   Neurological: Negative.   Endo/Heme/Allergies: Negative.   Psychiatric/Behavioral: Negative.      Objective:     Today's Vitals   02/10/18 1513  BP: 138/90  Pulse: 81  Resp: 12  Temp: 98 F (36.7 C)  SpO2: 94%  Weight: 101 lb (45.8 kg)  Height: 5\' 3"  (1.6 m)  PainSc: 0-No pain   Body mass index is 17.89 kg/m.  General appearance: alert, no distress, WD/WN, female HEENT: normocephalic, sclerae anicteric, TMs pearly, nares patent, no discharge or erythema, pharynx normal Oral cavity: MMM, no lesions Neck: supple, no lymphadenopathy, no thyromegaly, no masses Heart: RRR, normal S1, S2, no murmurs Lungs: CTA bilaterally, no wheezes, rhonchi, or rales Abdomen: +bs, soft, non tender, non distended, no masses, no hepatomegaly, no splenomegaly Musculoskeletal: nontender, no swelling, no obvious deformity Extremities: no edema, no cyanosis, no clubbing Pulses: 2+ symmetric, upper and lower extremities,  normal cap refill Neurological: alert, oriented x 3, CN2-12 intact, strength normal upper extremities and lower extremities, sensation normal throughout, DTRs 2+ throughout, no cerebellar signs, gait normal Psychiatric: normal affect, behavior normal, pleasant    Medicare Attestation I have personally reviewed: The patient's medical and social history Their use of alcohol, tobacco or illicit drugs Their current medications and supplements The patient's functional ability including ADLs,fall risks, home safety risks, cognitive, and hearing and visual impairment Diet and physical activities Evidence for depression or mood disorders  The patient's weight, height, BMI, and visual acuity have been recorded in the chart.  I have  made referrals, counseling, and provided education to the patient based on review of the above and I have provided the patient with a written personalized care plan for preventive services.    [name], PA-C   02/10/2018

## 2018-02-10 ENCOUNTER — Encounter: Payer: Self-pay | Admitting: Physician Assistant

## 2018-02-10 ENCOUNTER — Ambulatory Visit: Payer: Medicare Other | Admitting: Physician Assistant

## 2018-02-10 VITALS — BP 138/90 | HR 81 | Temp 98.0°F | Resp 12 | Ht 63.0 in | Wt 101.0 lb

## 2018-02-10 DIAGNOSIS — I5032 Chronic diastolic (congestive) heart failure: Secondary | ICD-10-CM | POA: Diagnosis not present

## 2018-02-10 DIAGNOSIS — D649 Anemia, unspecified: Secondary | ICD-10-CM

## 2018-02-10 DIAGNOSIS — R7989 Other specified abnormal findings of blood chemistry: Secondary | ICD-10-CM

## 2018-02-10 DIAGNOSIS — I248 Other forms of acute ischemic heart disease: Secondary | ICD-10-CM

## 2018-02-10 DIAGNOSIS — Z0001 Encounter for general adult medical examination with abnormal findings: Secondary | ICD-10-CM | POA: Diagnosis not present

## 2018-02-10 DIAGNOSIS — N183 Chronic kidney disease, stage 3 unspecified: Secondary | ICD-10-CM

## 2018-02-10 DIAGNOSIS — E785 Hyperlipidemia, unspecified: Secondary | ICD-10-CM

## 2018-02-10 DIAGNOSIS — R6889 Other general symptoms and signs: Secondary | ICD-10-CM

## 2018-02-10 DIAGNOSIS — Z8711 Personal history of peptic ulcer disease: Secondary | ICD-10-CM

## 2018-02-10 DIAGNOSIS — Z8719 Personal history of other diseases of the digestive system: Secondary | ICD-10-CM

## 2018-02-10 DIAGNOSIS — I10 Essential (primary) hypertension: Secondary | ICD-10-CM

## 2018-02-10 DIAGNOSIS — Z0000 Encounter for general adult medical examination without abnormal findings: Secondary | ICD-10-CM

## 2018-02-10 DIAGNOSIS — I6523 Occlusion and stenosis of bilateral carotid arteries: Secondary | ICD-10-CM | POA: Diagnosis not present

## 2018-02-10 DIAGNOSIS — E559 Vitamin D deficiency, unspecified: Secondary | ICD-10-CM

## 2018-02-10 DIAGNOSIS — Z79899 Other long term (current) drug therapy: Secondary | ICD-10-CM

## 2018-02-10 DIAGNOSIS — E441 Mild protein-calorie malnutrition: Secondary | ICD-10-CM

## 2018-02-10 DIAGNOSIS — K2970 Gastritis, unspecified, without bleeding: Secondary | ICD-10-CM

## 2018-02-10 NOTE — Patient Instructions (Addendum)
Can take the atenolol at night IF you continue to get dizzy with it   WEIGHT GAIN  Try to make sure you are eating plenty of high calorie dense foods like: Avocado Nuts Peanut butter Oatmeal Dates Cottage cheese [place] yogurt Protein powder Ensure  WOMEN AND HEART ATTACKS  Being a woman you may not have the typical symptoms of a heart attack.  You may not have any pain OR you may have atypical pain such as jaw pain, upper back pain, arm pain, "my bra feels to tight" and you will often have symptoms with it like below.  Symptoms for a heart attack will likely occur when you exert your self or exercise and include: Shortness of breath Sweating Nausea Dizziness Fast or irregular heart beats Fatigue   It makes me feel better if my patients get their heart rate up with exercise once or twice a week and pay close attention to your body. If there is ANY change in your exercise capacity or if you have symptoms above, please STOP and call 911 or call to come to the office.     Constipation, Adult Constipation is when a person has fewer bowel movements in a week than normal, has difficulty having a bowel movement, or has stools that are dry, hard, or larger than normal. Constipation may be caused by an underlying condition. It may become worse with age if a person takes certain medicines and does not take in enough fluids. Follow these instructions at home: Eating and drinking   Eat foods that have a lot of fiber, such as fresh fruits and vegetables, whole grains, and beans.  Limit foods that are high in fat, low in fiber, or overly processed, such as french fries, hamburgers, cookies, candies, and soda.  Drink enough fluid to keep your urine clear or pale yellow. General instructions  Exercise regularly or as told by your health care provider.  Go to the restroom when you have the urge to go. Do not hold it in.  Take over-the-counter and prescription medicines only as told by  your health care provider. These include any fiber supplements.  Practice pelvic floor retraining exercises, such as deep breathing while relaxing the lower abdomen and pelvic floor relaxation during bowel movements.  Watch your condition for any changes.  Keep all follow-up visits as told by your health care provider. This is important. Contact a health care provider if:  You have pain that gets worse.  You have a fever.  You do not have a bowel movement after 4 days.  You vomit.  You are not hungry.  You lose weight.  You are bleeding from the anus.  You have thin, pencil-like stools. Get help right away if:  You have a fever and your symptoms suddenly get worse.  You leak stool or have blood in your stool.  Your abdomen is bloated.  You have severe pain in your abdomen.  You feel dizzy or you faint. This information is not intended to replace advice given to you by your health care provider. Make sure you discuss any questions you have with your health care provider. Document Released: 01/13/2004 Document Revised: 11/04/2015 Document Reviewed: 10/05/2015 Elsevier Interactive Patient Education  2018 [employer].

## 2018-02-11 LAB — CBC WITH DIFFERENTIAL/PLATELET
BASOS ABS: 7 {cells}/uL (ref 0–200)
Basophils Relative: 0.1 %
EOS ABS: 40 {cells}/uL (ref 15–500)
EOS PCT: 0.6 %
HEMATOCRIT: 37.4 % (ref 35.0–45.0)
HEMOGLOBIN: 12.5 g/dL (ref 11.7–15.5)
LYMPHS ABS: 1407 {cells}/uL (ref 850–3900)
MCH: 29.6 pg (ref 27.0–33.0)
MCHC: 33.4 g/dL (ref 32.0–36.0)
MCV: 88.6 fL (ref 80.0–100.0)
MPV: 11.1 fL (ref 7.5–12.5)
Monocytes Relative: 9.4 %
NEUTROS ABS: 4616 {cells}/uL (ref 1500–7800)
Neutrophils Relative %: 68.9 %
Platelets: 198 10*3/uL (ref 140–400)
RBC: 4.22 10*6/uL (ref 3.80–5.10)
RDW: 13 % (ref 11.0–15.0)
Total Lymphocyte: 21 %
WBC: 6.7 10*3/uL (ref 3.8–10.8)
WBCMIX: 630 {cells}/uL (ref 200–950)

## 2018-02-11 LAB — COMPLETE METABOLIC PANEL WITH GFR
AG Ratio: 1.6 (calc) (ref 1.0–2.5)
ALKALINE PHOSPHATASE (APISO): 102 U/L (ref 33–130)
ALT: 16 U/L (ref 6–29)
AST: 18 U/L (ref 10–35)
Albumin: 4.5 g/dL (ref 3.6–5.1)
BUN: 11 mg/dL (ref 7–25)
CALCIUM: 10.3 mg/dL (ref 8.6–10.4)
CO2: 31 mmol/L (ref 20–32)
CREATININE: 0.75 mg/dL (ref 0.60–0.88)
Chloride: 101 mmol/L (ref 98–110)
GFR, EST NON AFRICAN AMERICAN: 73 mL/min/{1.73_m2} (ref >=60)
GFR, Est African American: 84 mL/min/{1.73_m2} (ref >=60)
GLUCOSE: 97 mg/dL (ref 65–99)
Globulin: 2.8 g/dL (calc) (ref 1.9–3.7)
Potassium: 3.8 mmol/L (ref 3.5–5.3)
Sodium: 140 mmol/L (ref 135–146)
Total Bilirubin: 1.2 mg/dL (ref 0.2–1.2)
Total Protein: 7.3 g/dL (ref 6.1–8.1)

## 2018-02-11 LAB — LIPID PANEL
CHOL/HDL RATIO: 3.5 (calc) (ref <5.0)
CHOLESTEROL: 202 mg/dL — AB (ref <200)
HDL: 58 mg/dL (ref >50)
LDL CHOLESTEROL (CALC): 125 mg/dL — AB
Non-HDL Cholesterol (Calc): 144 mg/dL (calc) — ABNORMAL HIGH (ref <130)
Triglycerides: 91 mg/dL (ref <150)

## 2018-02-11 LAB — VITAMIN D 25 HYDROXY (VIT D DEFICIENCY, FRACTURES): Vit D, 25-Hydroxy: 73 ng/mL (ref 30–100)

## 2018-02-11 LAB — MAGNESIUM: Magnesium: 1.9 mg/dL (ref 1.5–2.5)

## 2018-02-11 LAB — TSH: TSH: 4.38 m[IU]/L (ref 0.40–4.50)

## 2018-03-07 ENCOUNTER — Other Ambulatory Visit: Payer: Self-pay | Admitting: Physician Assistant

## 2018-03-14 ENCOUNTER — Other Ambulatory Visit: Payer: Self-pay | Admitting: Physician Assistant

## 2018-04-09 ENCOUNTER — Encounter: Payer: Self-pay | Admitting: Physician Assistant

## 2018-06-15 ENCOUNTER — Other Ambulatory Visit: Payer: Self-pay | Admitting: Internal Medicine

## 2018-08-12 ENCOUNTER — Other Ambulatory Visit: Payer: Self-pay | Admitting: Adult Health

## 2018-08-21 ENCOUNTER — Encounter: Payer: Self-pay | Admitting: Physician Assistant

## 2018-09-16 ENCOUNTER — Other Ambulatory Visit: Payer: Self-pay | Admitting: Physician Assistant

## 2018-10-22 NOTE — Progress Notes (Signed)
CPE  Assessment:    Chronic diastolic heart failure (HCC) - continue BP med, tolerating well, monitor BP/weight  Bilateral carotid artery stenosis Continue BP medication  Anemia, unspecified type -     Iron,Total/Total Iron Binding Cap -     Ferritin  CKD (chronic kidney disease), stage III (HCC) -     BASIC METABOLIC PANEL WITH GFR -     Microalbumin / creatinine urine ratio  History of gastric ulcer No bleeding at this time, still on protonix Had demand ischemia with this but otherwise walking, no issues.   Needs flu shot -     Flu vaccine HIGH DOSE PF  Medication management -     CBC with Differential/Platelet -     Hepatic function panel -     Magnesium  Mild malnutrition Start on ensure/boost  Demand ischemia ([place]) -     Lipid panel Continue to monitor BP Discussed symptoms of MI for women Declines EKG  Elevated TSH -     TSH  Medication management -     Magnesium  Vitamin D deficiency -     VITAMIN D 25 Hydroxy (Vit-D Deficiency, Fractures)   Over 40 minutes of exam, counseling, chart review and critical decision making was performed  Future Appointments  Date Time Provider [place]  02/19/2019  3:45 PM Vicie Mutters, PA-C [facility] None  05/25/2019  2:30 PM Unk Pinto, MD [facility] None  11/03/2019  2:00 PM Vicie Mutters, PA-C [facility] None     Subjective:  Erica Owen is a 85 y.o. female who presents for CPE and follow up for anemia.  She lives with her daughter, who takes her places. No issues with getting dressed, no issues with eating/cooking. No incontinence.   Had CT head 03/2015 IMPRESSION: Suspect small early infarct at the gray-white junction of the right posterior frontal -anterior temporal junction. Slight periventricular small vessel disease. 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 No hemorrhage or mass effect.   Her blood pressure has been controlled at home, today their BP is BP:  136/70 She does workout, walks alot. She denies chest pain, shortness of breath, no dizziness.  She has a history of upper GI bleed in Nov 2016, had demand ischemia, declined colonoscopy at that time.  Has aversion to doctors and medicine and still does not want any medications.  She is not on cholesterol medication and denies myalgias. Her cholesterol is at goal. The cholesterol last visit was:   Lab Results  Component Value Date   CHOL 180 10/23/2018   HDL 51 10/23/2018   LDLCALC 115 (H) 10/23/2018   TRIG 62 10/23/2018   CHOLHDL 3.5 10/23/2018   . Last A1C in the office was:  Lab Results  Component Value Date   HGBA1C 5.3 08/23/2016   BMI is Body mass index is 17.96 kg/m., she is working on diet and exercise. She is still active, not doing ensure anymore, weight has been going down. Has no appetite.  Wt Readings from Last 3 Encounters:  10/23/18 101 lb 6.4 oz (46 kg)  02/10/18 101 lb (45.8 kg)  04/01/17 107 lb (48.5 kg)    Medication Review: Current Outpatient Medications on File Prior to Visit  Medication Sig Dispense Refill  . acetaminophen (TYLENOL) 500 MG tablet Take 500 mg by mouth every 6 (six) hours as needed for mild pain.    Marland Kitchen atenolol (TENORMIN) 50 MG tablet TAKE ONE TABLET BY MOUTH DAILY 90 tablet 0  . pantoprazole (PROTONIX)  40 MG tablet Take 1 tablet daily for Acid Indigestion & Reflux 90 tablet 1  . VITAMIN D, CHOLECALCIFEROL, PO Take by mouth.     No current facility-administered medications on file prior to visit.     Current Problems (verified) Patient Active Problem List   Diagnosis Date Noted  . Mild malnutrition (HCC) 04/01/2017  . Chronic diastolic heart failure (HCC) 02/23/2016  . Carotid artery stenosis 03/06/2015  . CKD (chronic kidney disease), stage III (HCC) 03/06/2015  . Gastritis 03/06/2015  . Demand ischemia (HCC) 03/05/2015  . Elevated TSH 03/05/2015  . History of gastric ulcer   . Absolute anemia     Screening Tests Preventative  care: Last colonoscopy: declines Last mammogram: declines Last pap smear/pelvic exam: declines  DEXA: declines  EGD 03/2015 CT head 2016 CXR 03/2015 Echo 03/2015 55-60%, grade 1 diastolic  Prior vaccinations: TD or Tdap: declines  Influenza: 2019 Pneumococcal: declines Prevnar13: declines Shingles/Zostavax: declines  Names of Other Physician/Practitioners you currently use: 1. [facility] here for primary care 2. None, eye doctor, last visit declines 3. None, dentist, has dentures.  Dr. Rhea BeltonPyrtle Patient Care Team: Lucky CowboyMcKeown, William, MD as PCP - General (Internal Medicine)  Allergies No Known Allergies  SURGICAL HISTORY She  has a past surgical history that includes Abdominal hysterectomy and Esophagogastroduodenoscopy (egd) with propofol (N/A, 03/03/2015). FAMILY HISTORY Her family history includes Colon cancer in her brother; Heart attack in her father; Kidney failure in her mother; Lupus in her mother; Pancreatic cancer in her sister. SOCIAL HISTORY She  reports that she quit smoking about 53 years ago. Her smoking use included cigarettes. She has never used smokeless tobacco. She reports that she does not drink alcohol or use drugs.   Review of Systems  Constitutional: Negative.   HENT: Negative.   Eyes: Negative.   Respiratory: Negative.   Cardiovascular: Negative.   Gastrointestinal: Negative.   Genitourinary: Negative.   Musculoskeletal: Negative.   Skin: Negative.   Neurological: Negative.   Endo/Heme/Allergies: Negative.   Psychiatric/Behavioral: Negative.      Objective:     Today's Vitals   10/23/18 1352  BP: 136/70  Pulse: 63  Temp: 98.6 F (37 C)  SpO2: 96%  Weight: 101 lb 6.4 oz (46 kg)  Height: 5\' 3"  (1.6 m)   Body mass index is 17.96 kg/m.  General appearance: alert, no distress, WD/WN, female HEENT: normocephalic, sclerae anicteric, TMs pearly, nares patent, no discharge or erythema, pharynx  normal Oral cavity: MMM, no lesions Neck: supple, no lymphadenopathy, no thyromegaly, no masses Heart: RRR, normal S1, S2, no murmurs Lungs: CTA bilaterally, no wheezes, rhonchi, or rales Abdomen: +bs, soft, non tender, non distended, no masses, no hepatomegaly, no splenomegaly Musculoskeletal: nontender, no swelling, no obvious deformity Extremities: no edema, no cyanosis, no clubbing Pulses: 2+ symmetric, upper and lower extremities, normal cap refill Neurological: alert, oriented x 3, CN2-12 intact, strength normal upper extremities and lower extremities, sensation normal throughout, DTRs 2+ throughout, no cerebellar signs, gait normal Psychiatric: normal affect, behavior normal, pleasant    Quentin Mullingmanda , PA-C   10/24/2018

## 2018-10-23 ENCOUNTER — Ambulatory Visit: Payer: Medicare Other | Admitting: Physician Assistant

## 2018-10-23 ENCOUNTER — Encounter: Payer: Self-pay | Admitting: Physician Assistant

## 2018-10-23 ENCOUNTER — Other Ambulatory Visit: Payer: Self-pay

## 2018-10-23 VITALS — BP 136/70 | HR 63 | Temp 98.6°F | Ht 63.0 in | Wt 101.4 lb

## 2018-10-23 DIAGNOSIS — E559 Vitamin D deficiency, unspecified: Secondary | ICD-10-CM

## 2018-10-23 DIAGNOSIS — I5032 Chronic diastolic (congestive) heart failure: Secondary | ICD-10-CM

## 2018-10-23 DIAGNOSIS — Z0000 Encounter for general adult medical examination without abnormal findings: Secondary | ICD-10-CM | POA: Diagnosis not present

## 2018-10-23 DIAGNOSIS — R7989 Other specified abnormal findings of blood chemistry: Secondary | ICD-10-CM

## 2018-10-23 DIAGNOSIS — I6523 Occlusion and stenosis of bilateral carotid arteries: Secondary | ICD-10-CM

## 2018-10-23 DIAGNOSIS — I248 Other forms of acute ischemic heart disease: Secondary | ICD-10-CM

## 2018-10-23 DIAGNOSIS — Z8711 Personal history of peptic ulcer disease: Secondary | ICD-10-CM

## 2018-10-23 DIAGNOSIS — N183 Chronic kidney disease, stage 3 unspecified: Secondary | ICD-10-CM

## 2018-10-23 DIAGNOSIS — K2970 Gastritis, unspecified, without bleeding: Secondary | ICD-10-CM

## 2018-10-23 DIAGNOSIS — D649 Anemia, unspecified: Secondary | ICD-10-CM | POA: Diagnosis not present

## 2018-10-23 DIAGNOSIS — Z79899 Other long term (current) drug therapy: Secondary | ICD-10-CM

## 2018-10-23 DIAGNOSIS — Z8719 Personal history of other diseases of the digestive system: Secondary | ICD-10-CM

## 2018-10-23 DIAGNOSIS — E441 Mild protein-calorie malnutrition: Secondary | ICD-10-CM

## 2018-10-24 LAB — COMPLETE METABOLIC PANEL WITH GFR
AG Ratio: 1.3 (calc) (ref 1.0–2.5)
ALT: 12 U/L (ref 6–29)
AST: 14 U/L (ref 10–35)
Albumin: 3.9 g/dL (ref 3.6–5.1)
Alkaline phosphatase (APISO): 142 U/L (ref 37–153)
BUN: 12 mg/dL (ref 7–25)
CO2: 30 mmol/L (ref 20–32)
Calcium: 10.2 mg/dL (ref 8.6–10.4)
Chloride: 101 mmol/L (ref 98–110)
Creat: 0.68 mg/dL (ref 0.60–0.88)
GFR, Est African American: 92 mL/min/{1.73_m2} (ref >=60)
GFR, Est Non African American: 80 mL/min/{1.73_m2} (ref >=60)
Globulin: 2.9 g/dL (calc) (ref 1.9–3.7)
Glucose, Bld: 91 mg/dL (ref 65–99)
Potassium: 4.2 mmol/L (ref 3.5–5.3)
Sodium: 141 mmol/L (ref 135–146)
Total Bilirubin: 0.8 mg/dL (ref 0.2–1.2)
Total Protein: 6.8 g/dL (ref 6.1–8.1)

## 2018-10-24 LAB — CBC WITH DIFFERENTIAL/PLATELET
Absolute Monocytes: 613 cells/uL (ref 200–950)
Basophils Absolute: 29 cells/uL (ref 0–200)
Basophils Relative: 0.4 %
Eosinophils Absolute: 22 cells/uL (ref 15–500)
Eosinophils Relative: 0.3 %
HCT: 37.1 % (ref 35.0–45.0)
Hemoglobin: 12.1 g/dL (ref 11.7–15.5)
Lymphs Abs: 1307 cells/uL (ref 850–3900)
MCH: 28.7 pg (ref 27.0–33.0)
MCHC: 32.6 g/dL (ref 32.0–36.0)
MCV: 87.9 fL (ref 80.0–100.0)
MPV: 10.5 fL (ref 7.5–12.5)
Monocytes Relative: 8.4 %
Neutro Abs: 5329 cells/uL (ref 1500–7800)
Neutrophils Relative %: 73 %
Platelets: 281 10*3/uL (ref 140–400)
RBC: 4.22 10*6/uL (ref 3.80–5.10)
RDW: 13 % (ref 11.0–15.0)
Total Lymphocyte: 17.9 %
WBC: 7.3 10*3/uL (ref 3.8–10.8)

## 2018-10-24 LAB — IRON, TOTAL/TOTAL IRON BINDING CAP
%SAT: 13 % (calc) — ABNORMAL LOW (ref 16–45)
Iron: 29 ug/dL — ABNORMAL LOW (ref 45–160)
TIBC: 217 mcg/dL (calc) — ABNORMAL LOW (ref 250–450)

## 2018-10-24 LAB — VITAMIN B12: Vitamin B-12: 329 pg/mL (ref 200–1100)

## 2018-10-24 LAB — LIPID PANEL
Cholesterol: 180 mg/dL (ref <200)
HDL: 51 mg/dL (ref >=50)
LDL Cholesterol (Calc): 115 mg/dL (calc) — ABNORMAL HIGH
Non-HDL Cholesterol (Calc): 129 mg/dL (calc) (ref <130)
Total CHOL/HDL Ratio: 3.5 (calc) (ref <5.0)
Triglycerides: 62 mg/dL (ref <150)

## 2018-10-24 LAB — MAGNESIUM: Magnesium: 2.1 mg/dL (ref 1.5–2.5)

## 2018-10-24 LAB — FERRITIN: Ferritin: 586 ng/mL — ABNORMAL HIGH (ref 16–288)

## 2018-10-24 LAB — VITAMIN D 25 HYDROXY (VIT D DEFICIENCY, FRACTURES): Vit D, 25-Hydroxy: 69 ng/mL (ref 30–100)

## 2018-10-24 LAB — TSH: TSH: 5.06 mIU/L — ABNORMAL HIGH (ref 0.40–4.50)

## 2018-11-09 ENCOUNTER — Other Ambulatory Visit: Payer: Self-pay | Admitting: Adult Health

## 2018-12-07 ENCOUNTER — Other Ambulatory Visit: Payer: Self-pay | Admitting: Physician Assistant

## 2019-02-19 ENCOUNTER — Ambulatory Visit: Payer: Self-pay | Admitting: Physician Assistant

## 2019-03-03 NOTE — Progress Notes (Signed)
MEDICARE WELLNESS  Assessment:    Chronic diastolic heart failure (HCC) - continue BP med, increase BB and monitor BP, follow up 1-2 months, tolerating well, monitor BP/weight Go to the ER if any chest pain, shortness of breath, nausea, dizziness, severe HA, changes vision/speech  Bilateral carotid artery stenosis Continue BP medication  Anemia, unspecified type -     Check CBC  CKD (chronic kidney disease), stage III (HCC) -     BASIC METABOLIC PANEL WITH GFR -     Microalbumin / creatinine urine ratio  History of gastric ulcer No bleeding at this time, still on protonix   Needs flu shot -     Flu vaccine HIGH DOSE PF  Medication management -     CBC with Differential/Platelet -     Hepatic function panel -     Magnesium  Mild malnutrition Start on ensure/boost  Demand ischemia (HCC) -     Lipid panel - no symptoms at this time Continue to monitor BP Discussed symptoms of MI for women  EKG with no acute changes but possible strain  Elevated TSH -     TSH  Medication management -     Magnesium  Vitamin D deficiency -     VITAMIN D 25 Hydroxy (Vit-D Deficiency, Fractures)   Over 40 minutes of exam, counseling, chart review and critical decision making was performed  Future Appointments  Date Time Provider [place]  05/25/2019  2:30 PM Unk Pinto, MD [facility] None  11/03/2019  2:00 PM Vicie Mutters, PA-C [facility] None     Subjective:  Erica Owen is a 85 y.o. female who presents for [place] and follow up for anemia.   She lives with her daughter, who takes her places. No issues with getting dressed, no issues with eating/cooking. No incontinence.  Daughter does her medications.  Her BP is elevated, no end organ evidence but daughter states that her mom becomes more fatigued while walked recently.  Daughter states she is eating more tums lately. Patient denies any pain, dark black stool.    Her blood pressure has not been  checked at home, today their BP is BP: (!) 190/100. She is not on any NSAIDS, cold medication. She denies any associated neurological complications or symptoms, such as one-sided weakness, numbness, tingling, slurring of speech, droopy face, swallowing difficulties, diplopia, vision loss, hearing loss or tinnitus.  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 Slight periventricular small vessel disease. 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 No hemorrhage or mass effect.  She does workout, walks alot. She denies chest pain, shortness of breath, no dizziness.  She has a history of upper GI bleed in Nov 2016, had demand ischemia, declined colonoscopy at that time.  Has aversion to doctors and medicine and still does not want any medications.  She is not on cholesterol medication and denies myalgias. Her cholesterol is at goal. The cholesterol last visit was:   Lab Results  Component Value Date   CHOL 180 10/23/2018   HDL 51 10/23/2018   LDLCALC 115 (H) 10/23/2018   TRIG 62 10/23/2018   CHOLHDL 3.5 10/23/2018   . Last A1C in the office was:  Lab Results  Component Value Date   HGBA1C 5.3 08/23/2016   BMI is Body mass index  is 17.89 kg/m., she is working on diet and exercise. She is still active, not doing ensure anymore, she is doing ice cream. Has no appetite.  Wt Readings from Last 3 Encounters:  03/04/19 101 lb (45.8 kg)  10/23/18 101 lb 6.4 oz (46 kg)  02/10/18 101 lb (45.8 kg)    Medication Review: Current Outpatient Medications on File Prior to Visit  Medication Sig Dispense Refill  . acetaminophen (TYLENOL) 500 MG tablet Take 500 mg by mouth every 6 (six) hours as needed for mild pain.    . Ascorbic Acid (VITAMIN C PO) Take 500 mg by mouth daily.    Marland Kitchen  atenolol (TENORMIN) 50 MG tablet Take 1 tablet Daily for BP 90 tablet 1  . Calcium Carbonate Antacid (TUMS PO) Take by mouth daily.    . IRON, FERROUS SULFATE, PO Take 65 mg by mouth daily.    . pantoprazole (PROTONIX) 40 MG tablet Take 1 tablet for Indigestion & Heartburn 90 tablet 1  . VITAMIN D, CHOLECALCIFEROL, PO Take by mouth.     No current facility-administered medications on file prior to visit.     Current Problems (verified) Patient Active Problem List   Diagnosis Date Noted  . Mild malnutrition (HCC) 04/01/2017  . Chronic diastolic heart failure (HCC) 02/23/2016  . Carotid artery stenosis 03/06/2015  . CKD (chronic kidney disease), stage III 03/06/2015  . Gastritis 03/06/2015  . Demand ischemia (HCC) 03/05/2015  . Elevated TSH 03/05/2015  . History of gastric ulcer   . Absolute anemia     Screening Tests Preventative care: Last colonoscopy: declines Last mammogram: declines Last pap smear/pelvic exam: declines  DEXA: declines  EGD 03/2015 CT head 2016 CXR 03/2015 Echo 03/2015 55-60%, grade 1 diastolic  Prior vaccinations: TD or Tdap: declines  Influenza: 2020 Pneumococcal: declines Prevnar13: declines Shingles/Zostavax: declines  Names of Other Physician/Practitioners you currently use: 1. [facility] here for primary care 2. None, eye doctor, last visit declines 3. None, dentist, has dentures.  Dr. Rhea Belton Patient Care Team: Lucky Cowboy, MD as PCP - General (Internal Medicine)  Allergies No Known Allergies  SURGICAL HISTORY She  has a past surgical history that includes Abdominal hysterectomy and Esophagogastroduodenoscopy (egd) with propofol (N/A, 03/03/2015). FAMILY HISTORY Her family history includes Colon cancer in her brother; Heart attack in her father; Kidney failure in her mother; Lupus in her mother; Pancreatic cancer in her sister. SOCIAL HISTORY She  reports that she quit smoking about 54 years  ago. Her smoking use included cigarettes. She has never used smokeless tobacco. She reports that she does not drink alcohol or use drugs.  MEDICARE WELLNESS OBJECTIVES: Physical activity: Current Exercise Habits: The patient does not participate in regular exercise at present Cardiac risk factors: Cardiac Risk Factors include: advanced age (>35men, >76 women);dyslipidemia;hypertension;sedentary lifestyle Depression/mood screen:   Depression screen [facility] 2/9 03/04/2019  Decreased Interest 0  Down, Depressed, Hopeless 0  PHQ - 2 Score 0    ADLs:  In your present state of health, do you have any difficulty performing the following activities: 03/04/2019 10/23/2018  Hearing? N N  Vision? N N  Difficulty concentrating or making decisions? Malvin Johns  Walking or climbing stairs? N N  Dressing or bathing? N N  Doing errands, shopping? Malvin Johns  Preparing Food and eating ? N N  Using the Toilet? N N  In the past six months, have you accidently leaked urine? N N  Do you have problems with loss  of bowel control? N N  Managing your Medications? Y -  Managing your Finances? Y N  Housekeeping or managing your Housekeeping? Y N  Some recent data might be hidden     Cognitive Testing  Alert? Yes  Normal Appearance?Yes  Oriented to person? Yes  Place? Yes   Time? Yes  Recall of three objects?  Yes  Can perform simple calculations? Yes  Displays appropriate judgment?Yes  Can read the correct time from a watch face?Yes  EOL planning: Does Patient Have a Medical Advance Directive?: No Would patient like information on creating a medical advance directive?: No - Patient declined   Review of Systems  Constitutional: Negative.   HENT: Negative.   Eyes: Negative.   Respiratory: Negative.   Cardiovascular: Negative.   Gastrointestinal: Negative.   Genitourinary: Negative.   Musculoskeletal: Negative.   Skin: Negative.   Neurological: Negative.   Endo/Heme/Allergies: Negative.   Psychiatric/Behavioral:  Negative.      Objective:     Today's Vitals   03/04/19 1532  BP: (!) 190/100  Pulse: 94  Temp: 98.1 F (36.7 C)  SpO2: 96%  Weight: 101 lb (45.8 kg)  Height: 5\' 3"  (1.6 m)   Body mass index is 17.89 kg/m.  General appearance: alert, no distress, WD/WN, female HEENT: normocephalic, sclerae anicteric, TMs pearly, nares patent, no discharge or erythema, pharynx normal Oral cavity: MMM, no lesions Neck: supple, no lymphadenopathy, no thyromegaly, no masses Heart: RRR, normal S1, S2, no murmurs Lungs: CTA bilaterally, no wheezes, rhonchi, or rales Abdomen: +bs, soft, non tender, non distended, no masses, no hepatomegaly, no splenomegaly Musculoskeletal: nontender, no swelling, no obvious deformity Extremities: no edema, no cyanosis, no clubbing Pulses: 2+ symmetric, upper and lower extremities, normal cap refill Neurological: alert, oriented x 3, CN2-12 intact, strength normal upper extremities and lower extremities, sensation normal throughout, DTRs 2+ throughout, no cerebellar signs, gait normal Psychiatric: normal affect, behavior normal, pleasant    Medicare Attestation I have personally reviewed: The patient's medical and social history Their use of alcohol, tobacco or illicit drugs Their current medications and supplements The patient's functional ability including ADLs,fall risks, home safety risks, cognitive, and hearing and visual impairment Diet and physical activities Evidence for depression or mood disorders  The patient's weight, height, BMI, and visual acuity have been recorded in the chart.  I have made referrals, counseling, and provided education to the patient based on review of the above and I have provided the patient with a written personalized care plan for preventive services.    Quentin Mullingmanda , PA-C   03/04/2019

## 2019-03-04 ENCOUNTER — Other Ambulatory Visit: Payer: Self-pay

## 2019-03-04 ENCOUNTER — Ambulatory Visit (INDEPENDENT_AMBULATORY_CARE_PROVIDER_SITE_OTHER): Payer: Medicare Other | Admitting: Physician Assistant

## 2019-03-04 ENCOUNTER — Encounter: Payer: Self-pay | Admitting: Physician Assistant

## 2019-03-04 VITALS — BP 190/100 | HR 94 | Temp 98.1°F | Ht 63.0 in | Wt 101.0 lb

## 2019-03-04 DIAGNOSIS — K2970 Gastritis, unspecified, without bleeding: Secondary | ICD-10-CM

## 2019-03-04 DIAGNOSIS — I6523 Occlusion and stenosis of bilateral carotid arteries: Secondary | ICD-10-CM

## 2019-03-04 DIAGNOSIS — I5032 Chronic diastolic (congestive) heart failure: Secondary | ICD-10-CM

## 2019-03-04 DIAGNOSIS — E785 Hyperlipidemia, unspecified: Secondary | ICD-10-CM | POA: Diagnosis not present

## 2019-03-04 DIAGNOSIS — Z0001 Encounter for general adult medical examination with abnormal findings: Secondary | ICD-10-CM | POA: Diagnosis not present

## 2019-03-04 DIAGNOSIS — I2489 Other forms of acute ischemic heart disease: Secondary | ICD-10-CM

## 2019-03-04 DIAGNOSIS — R7989 Other specified abnormal findings of blood chemistry: Secondary | ICD-10-CM | POA: Diagnosis not present

## 2019-03-04 DIAGNOSIS — I248 Other forms of acute ischemic heart disease: Secondary | ICD-10-CM | POA: Diagnosis not present

## 2019-03-04 DIAGNOSIS — Z8719 Personal history of other diseases of the digestive system: Secondary | ICD-10-CM

## 2019-03-04 DIAGNOSIS — I10 Essential (primary) hypertension: Secondary | ICD-10-CM

## 2019-03-04 DIAGNOSIS — N183 Chronic kidney disease, stage 3 unspecified: Secondary | ICD-10-CM | POA: Diagnosis not present

## 2019-03-04 DIAGNOSIS — E441 Mild protein-calorie malnutrition: Secondary | ICD-10-CM | POA: Diagnosis not present

## 2019-03-04 DIAGNOSIS — R6889 Other general symptoms and signs: Secondary | ICD-10-CM

## 2019-03-04 DIAGNOSIS — Z0000 Encounter for general adult medical examination without abnormal findings: Secondary | ICD-10-CM

## 2019-03-04 DIAGNOSIS — Z8711 Personal history of peptic ulcer disease: Secondary | ICD-10-CM

## 2019-03-04 DIAGNOSIS — D649 Anemia, unspecified: Secondary | ICD-10-CM | POA: Diagnosis not present

## 2019-03-04 DIAGNOSIS — E559 Vitamin D deficiency, unspecified: Secondary | ICD-10-CM

## 2019-03-04 DIAGNOSIS — Z79899 Other long term (current) drug therapy: Secondary | ICD-10-CM

## 2019-03-04 NOTE — Patient Instructions (Addendum)
HYPERTENSION INFORMATION  Monitor your blood pressure at home, please keep a record and bring that in with you to your next office visit.   Take 1/2 of the pill in the morning and 1 pill at night.  If continues to be elevated increase to 1 pill in the AM and 1 pill at night Monitor heart rate as well, let us know if it gets below 60.   Go to the ER if any CP, SOB, nausea, dizziness, severe HA, changes vision/speech  Add on 1/2-1 ensure a day, can add with ice cream.   Testing/Procedures: HOW TO TAKE YOUR BLOOD PRESSURE:  Rest 5 minutes before taking your blood pressure.  Don't smoke or drink caffeinated beverages for at least 30 minutes before.  Take your blood pressure before (not after) you eat.  Sit comfortably with your back supported and both feet on the floor (don't cross your legs).  Elevate your arm to heart level on a table or a desk.  Use the proper sized cuff. It should fit smoothly and snugly around your bare upper arm. There should be enough room to slip a fingertip under the cuff. The bottom edge of the cuff should be 1 inch above the crease of the elbow.  Due to a recent study, SPRINT, we have changed our goal for the systolic or top blood pressure number. Ideally we want your top number at 120.  In the [facility] Trial, 5000 people were randomized to a goal BP of 120 and 5000 people were randomized to a goal BP of less than 140. The patients with the goal BP at 120 had LESS DEMENTIA, LESS HEART ATTACKS, AND LESS STROKES, AS WELL AS OVERALL DECREASED MORTALITY OR DEATH RATE.   There was another study that showed taking your blood pressure medications at night decrease cardiovascular events.  However if you are on a fluid pill, please take this in the morning.   If you are willing, our goal BP is the top number of 120.  Your most recent BP: BP: (!) 190/100   Take your medications faithfully as instructed. Maintain a healthy weight. Get at least 150 minutes of aerobic  exercise per week. Minimize salt intake. Minimize alcohol intake  DASH Eating Plan DASH stands for "Dietary Approaches to Stop Hypertension." The DASH eating plan is a healthy eating plan that has been shown to reduce high blood pressure (hypertension). Additional health benefits may include reducing the risk of type 2 diabetes mellitus, heart disease, and stroke. The DASH eating plan may also help with weight loss. WHAT DO I NEED TO KNOW ABOUT THE DASH EATING PLAN? For the DASH eating plan, you will follow these general guidelines:  Choose foods with a percent daily value for sodium of less than 5% (as listed on the food label).  Use salt-free seasonings or herbs instead of table salt or sea salt.  Check with your health care provider or pharmacist before using salt substitutes.  Eat lower-sodium products, often labeled as "lower sodium" or "no salt added."  Eat fresh foods.  Eat more vegetables, fruits, and low-fat dairy products.  Choose whole grains. Look for the word "whole" as the first word in the ingredient list.  Choose fish and skinless chicken or [place] more often than red meat. Limit fish, poultry, and meat to 6 oz (170 g) each day.  Limit sweets, desserts, sugars, and sugary drinks.  Choose heart-healthy fats.  Limit cheese to 1 oz (28 g) per day.  Eat more  home-cooked food and less restaurant, buffet, and fast food.  Limit fried foods.  Cook foods using methods other than frying.  Limit canned vegetables. If you do use them, rinse them well to decrease the sodium.  When eating at a restaurant, ask that your food be prepared with less salt, or no salt if possible. WHAT FOODS CAN I EAT? Seek help from a dietitian for individual calorie needs. Grains Whole grain or whole wheat bread. Brown rice. Whole grain or whole wheat pasta. Quinoa, bulgur, and whole grain cereals. Low-sodium cereals. Corn or whole wheat flour tortillas. Whole grain cornbread. Whole grain  crackers. Low-sodium crackers. Vegetables Fresh or frozen vegetables (raw, steamed, roasted, or grilled). Low-sodium or reduced-sodium tomato and vegetable juices. Low-sodium or reduced-sodium tomato sauce and paste. Low-sodium or reduced-sodium canned vegetables.  Fruits All fresh, canned (in natural juice), or frozen fruits. Meat and Other Protein Products Ground beef (85% or leaner), grass-fed beef, or beef trimmed of fat. Skinless chicken or [place]. Ground chicken or [place]. Pork trimmed of fat. All fish and seafood. Eggs. Dried beans, peas, or lentils. Unsalted nuts and seeds. Unsalted canned beans. Dairy Low-fat dairy products, such as skim or 1% milk, 2% or reduced-fat cheeses, low-fat ricotta or cottage cheese, or plain low-fat yogurt. Low-sodium or reduced-sodium cheeses. Fats and Oils Tub margarines without trans fats. Light or reduced-fat mayonnaise and salad dressings (reduced sodium). Avocado. Safflower, olive, or canola oils. Natural peanut or almond butter. Other Unsalted popcorn and pretzels. The items listed above may not be a complete list of recommended foods or beverages. Contact your dietitian for more options. WHAT FOODS ARE NOT RECOMMENDED? Grains White bread. White pasta. White rice. Refined cornbread. Bagels and croissants. Crackers that contain trans fat. Vegetables Creamed or fried vegetables. Vegetables in a cheese sauce. Regular canned vegetables. Regular canned tomato sauce and paste. Regular tomato and vegetable juices. Fruits Dried fruits. Canned fruit in light or heavy syrup. Fruit juice. Meat and Other Protein Products Fatty cuts of meat. Ribs, chicken wings, bacon, sausage, bologna, salami, chitterlings, fatback, hot dogs, bratwurst, and packaged luncheon meats. Salted nuts and seeds. Canned beans with salt. Dairy Whole or 2% milk, cream, half-and-half, and cream cheese. Whole-fat or sweetened yogurt. Full-fat cheeses or blue cheese. Nondairy creamers and  whipped toppings. Processed cheese, cheese spreads, or cheese curds. Condiments Onion and garlic salt, seasoned salt, table salt, and sea salt. Canned and packaged gravies. Worcestershire sauce. Tartar sauce. Barbecue sauce. Teriyaki sauce. Soy sauce, including reduced sodium. Steak sauce. Fish sauce. Oyster sauce. Cocktail sauce. Horseradish. Ketchup and mustard. Meat flavorings and tenderizers. Bouillon cubes. Hot sauce. Tabasco sauce. Marinades. Taco seasonings. Relishes. Fats and Oils Butter, stick margarine, lard, shortening, ghee, and bacon fat. Coconut, palm kernel, or palm oils. Regular salad dressings. Other Pickles and olives. Salted popcorn and pretzels. The items listed above may not be a complete list of foods and beverages to avoid. Contact your dietitian for more information. WHERE CAN I FIND MORE INFORMATION? National Heart, Lung, and Blood Institute: travelstabloid.com Document Released: 04/05/2011 Document Revised: 08/31/2013 Document Reviewed: 02/18/2013 [facility] Patient Information 2015 [place], [place]. This information is not intended to replace advice given to you by your health care provider. Make sure you discuss any questions you have with your health care provider.

## 2019-03-05 LAB — COMPLETE METABOLIC PANEL WITH GFR
AG Ratio: 1.7 (calc) (ref 1.0–2.5)
ALT: 14 U/L (ref 6–29)
AST: 17 U/L (ref 10–35)
Albumin: 4.5 g/dL (ref 3.6–5.1)
Alkaline phosphatase (APISO): 119 U/L (ref 37–153)
BUN/Creatinine Ratio: 19 (calc) (ref 6–22)
BUN: 18 mg/dL (ref 7–25)
CO2: 28 mmol/L (ref 20–32)
Calcium: 10.5 mg/dL — ABNORMAL HIGH (ref 8.6–10.4)
Chloride: 101 mmol/L (ref 98–110)
Creat: 0.93 mg/dL — ABNORMAL HIGH (ref 0.60–0.88)
GFR, Est African American: 64 mL/min/{1.73_m2} (ref >=60)
GFR, Est Non African American: 56 mL/min/{1.73_m2} — ABNORMAL LOW (ref >=60)
Globulin: 2.7 g/dL (calc) (ref 1.9–3.7)
Glucose, Bld: 94 mg/dL (ref 65–99)
Potassium: 4.4 mmol/L (ref 3.5–5.3)
Sodium: 140 mmol/L (ref 135–146)
Total Bilirubin: 0.8 mg/dL (ref 0.2–1.2)
Total Protein: 7.2 g/dL (ref 6.1–8.1)

## 2019-03-05 LAB — LIPID PANEL
Cholesterol: 197 mg/dL (ref <200)
HDL: 62 mg/dL (ref >=50)
LDL Cholesterol (Calc): 115 mg/dL (calc) — ABNORMAL HIGH
Non-HDL Cholesterol (Calc): 135 mg/dL (calc) — ABNORMAL HIGH (ref <130)
Total CHOL/HDL Ratio: 3.2 (calc) (ref <5.0)
Triglycerides: 101 mg/dL (ref <150)

## 2019-03-05 LAB — CBC WITH DIFFERENTIAL/PLATELET
Absolute Monocytes: 714 cells/uL (ref 200–950)
Basophils Absolute: 34 cells/uL (ref 0–200)
Basophils Relative: 0.4 %
Eosinophils Absolute: 34 cells/uL (ref 15–500)
Eosinophils Relative: 0.4 %
HCT: 38.8 % (ref 35.0–45.0)
Hemoglobin: 12.8 g/dL (ref 11.7–15.5)
Lymphs Abs: 1218 cells/uL (ref 850–3900)
MCH: 28.8 pg (ref 27.0–33.0)
MCHC: 33 g/dL (ref 32.0–36.0)
MCV: 87.2 fL (ref 80.0–100.0)
MPV: 11.1 fL (ref 7.5–12.5)
Monocytes Relative: 8.5 %
Neutro Abs: 6401 cells/uL (ref 1500–7800)
Neutrophils Relative %: 76.2 %
Platelets: 217 10*3/uL (ref 140–400)
RBC: 4.45 10*6/uL (ref 3.80–5.10)
RDW: 13.3 % (ref 11.0–15.0)
Total Lymphocyte: 14.5 %
WBC: 8.4 10*3/uL (ref 3.8–10.8)

## 2019-03-05 LAB — URINALYSIS, ROUTINE W REFLEX MICROSCOPIC
Bacteria, UA: NONE SEEN /HPF
Bilirubin Urine: NEGATIVE
Glucose, UA: NEGATIVE
Hgb urine dipstick: NEGATIVE
Hyaline Cast: NONE SEEN /LPF
Ketones, ur: NEGATIVE
Nitrite: NEGATIVE
Protein, ur: NEGATIVE
RBC / HPF: NONE SEEN /HPF (ref 0–2)
Specific Gravity, Urine: 1.008 (ref 1.001–1.03)
Squamous Epithelial / HPF: NONE SEEN /HPF (ref <=5)
pH: 7 (ref 5.0–8.0)

## 2019-03-05 LAB — MICROALBUMIN / CREATININE URINE RATIO
Creatinine, Urine: 36 mg/dL (ref 20–275)
Microalb Creat Ratio: 72 ug/mg{creat} — ABNORMAL HIGH (ref <30)
Microalb, Ur: 2.6 mg/dL

## 2019-03-05 LAB — TSH: TSH: 4.66 mIU/L — ABNORMAL HIGH (ref 0.40–4.50)

## 2019-03-05 LAB — VITAMIN D 25 HYDROXY (VIT D DEFICIENCY, FRACTURES): Vit D, 25-Hydroxy: 45 ng/mL (ref 30–100)

## 2019-03-05 LAB — MAGNESIUM: Magnesium: 2 mg/dL (ref 1.5–2.5)

## 2019-03-10 ENCOUNTER — Telehealth: Payer: Self-pay | Admitting: Physician Assistant

## 2019-03-10 DIAGNOSIS — I10 Essential (primary) hypertension: Secondary | ICD-10-CM

## 2019-03-10 MED ORDER — LEVOTHYROXINE SODIUM 25 MCG PO TABS: 25.0000 ug | ORAL_TABLET | Freq: Every day | ORAL | 1 refills | Status: DC

## 2019-03-10 NOTE — Telephone Encounter (Signed)
Please increase the atenolol to a whole pill and continue to monitor her blood pressure.  Will send in very low dose of thyroid medication.  Follow up 1 month  Recent Visits Date Type Provider Dept  03/04/19 Office Visit Vicie Mutters, [place]  10/23/18 Office Visit Vicie Mutters, PA-C [facility]  02/10/18 Office Visit Vicie Mutters, PA-C [place] recent visits within past 540 days with a meds authorizing provider and meeting all other requirements   Future Appointments Date Type Provider Dept  05/25/19 Appointment Unk Pinto, MD [place] future appointments within next 150 days with a meds authorizing provider and meeting all other requirements     Medications   Current Outpatient Medications (Cardiovascular):  .  atenolol (TENORMIN) 50 MG tablet, Take 1 tablet Daily for BP   Current Outpatient Medications (Analgesics):  .  acetaminophen (TYLENOL) 500 MG tablet, Take 500 mg by mouth every 6 (six) hours as needed for mild pain.  Current Outpatient Medications (Hematological):  Marland Kitchen  IRON, FERROUS SULFATE, PO, Take 65 mg by mouth daily.  Current Outpatient Medications (Other):  Marland Kitchen  Ascorbic Acid (VITAMIN C PO), Take 500 mg by mouth daily. .  Calcium Carbonate Antacid (TUMS PO), Take by mouth daily. .  pantoprazole (PROTONIX) 40 MG tablet, Take 1 tablet for Indigestion & Heartburn .  VITAMIN D, CHOLECALCIFEROL, PO, Take by mouth.  Problem list She has History of gastric ulcer; Absolute anemia; Demand ischemia ([place]); Elevated TSH; Carotid artery stenosis; CKD (chronic kidney disease), stage III; Gastritis; Chronic diastolic heart failure ([place]); and Mild malnutrition ([place]) on their problem list.

## 2019-03-10 NOTE — Telephone Encounter (Signed)
Unable to reach Pt's caregiver to inform of med change

## 2019-03-10 NOTE — Telephone Encounter (Signed)
-----   Message from Elenor Quinones, [place] sent at 03/10/2019 10:59 AM EST ----- The patient has been notified of this information and all questions answered.  Patient's caregiver states that she would like to start Mrs. Cheong on a low dose of medication for her THYROID.  Patient's caregiver also wanted to make you aware that she checks Mrs. Wildeman's bld pressure 2 to 3 times a day, and numbers are still high. 170/85----185/95 She takes half tablet of her bld pressure pill this was also reported by caregiver. Please advise on what to do.

## 2019-03-12 NOTE — Telephone Encounter (Signed)
Unable to reach patient.

## 2019-03-16 NOTE — Telephone Encounter (Signed)
No return call from patient as of NOV 16th 2020.

## 2019-04-26 ENCOUNTER — Other Ambulatory Visit: Payer: Self-pay

## 2019-04-26 ENCOUNTER — Encounter (HOSPITAL_BASED_OUTPATIENT_CLINIC_OR_DEPARTMENT_OTHER): Payer: Self-pay | Admitting: Emergency Medicine

## 2019-04-26 ENCOUNTER — Emergency Department (HOSPITAL_BASED_OUTPATIENT_CLINIC_OR_DEPARTMENT_OTHER)
Admission: EM | Admit: 2019-04-26 | Discharge: 2019-04-26 | Disposition: A | Discharge status: Home / Self Care | Payer: Medicare Other | Attending: Emergency Medicine | Admitting: Emergency Medicine

## 2019-04-26 ENCOUNTER — Emergency Department (HOSPITAL_BASED_OUTPATIENT_CLINIC_OR_DEPARTMENT_OTHER): Payer: Medicare Other

## 2019-04-26 DIAGNOSIS — S43015A Anterior dislocation of left humerus, initial encounter: Secondary | ICD-10-CM | POA: Diagnosis not present

## 2019-04-26 DIAGNOSIS — I4891 Unspecified atrial fibrillation: Secondary | ICD-10-CM

## 2019-04-26 DIAGNOSIS — Y92019 Unspecified place in single-family (private) house as the place of occurrence of the external cause: Secondary | ICD-10-CM | POA: Insufficient documentation

## 2019-04-26 DIAGNOSIS — S53105A Unspecified dislocation of left ulnohumeral joint, initial encounter: Secondary | ICD-10-CM | POA: Diagnosis not present

## 2019-04-26 DIAGNOSIS — Z7982 Long term (current) use of aspirin: Secondary | ICD-10-CM | POA: Insufficient documentation

## 2019-04-26 DIAGNOSIS — Z87891 Personal history of nicotine dependence: Secondary | ICD-10-CM | POA: Insufficient documentation

## 2019-04-26 DIAGNOSIS — R42 Dizziness and giddiness: Secondary | ICD-10-CM | POA: Insufficient documentation

## 2019-04-26 DIAGNOSIS — Z79899 Other long term (current) drug therapy: Secondary | ICD-10-CM | POA: Insufficient documentation

## 2019-04-26 DIAGNOSIS — Y999 Unspecified external cause status: Secondary | ICD-10-CM | POA: Insufficient documentation

## 2019-04-26 DIAGNOSIS — M25529 Pain in unspecified elbow: Secondary | ICD-10-CM

## 2019-04-26 DIAGNOSIS — S53195A Other dislocation of left ulnohumeral joint, initial encounter: Secondary | ICD-10-CM | POA: Diagnosis not present

## 2019-04-26 DIAGNOSIS — N183 Chronic kidney disease, stage 3 unspecified: Secondary | ICD-10-CM | POA: Diagnosis not present

## 2019-04-26 DIAGNOSIS — W19XXXA Unspecified fall, initial encounter: Secondary | ICD-10-CM | POA: Diagnosis not present

## 2019-04-26 DIAGNOSIS — Y939 Activity, unspecified: Secondary | ICD-10-CM | POA: Insufficient documentation

## 2019-04-26 DIAGNOSIS — I5032 Chronic diastolic (congestive) heart failure: Secondary | ICD-10-CM | POA: Insufficient documentation

## 2019-04-26 DIAGNOSIS — I130 Hypertensive heart and chronic kidney disease with heart failure and stage 1 through stage 4 chronic kidney disease, or unspecified chronic kidney disease: Secondary | ICD-10-CM | POA: Insufficient documentation

## 2019-04-26 DIAGNOSIS — S59902A Unspecified injury of left elbow, initial encounter: Secondary | ICD-10-CM | POA: Diagnosis present

## 2019-04-26 DIAGNOSIS — S0990XA Unspecified injury of head, initial encounter: Secondary | ICD-10-CM | POA: Diagnosis not present

## 2019-04-26 HISTORY — DX: Disorder of thyroid, unspecified: E07.9

## 2019-04-26 HISTORY — DX: Essential (primary) hypertension: I10

## 2019-04-26 LAB — CBC
HCT: 39.1 % (ref 36.0–46.0)
Hemoglobin: 12.1 g/dL (ref 12.0–15.0)
MCH: 29.2 pg (ref 26.0–34.0)
MCHC: 30.9 g/dL (ref 30.0–36.0)
MCV: 94.2 fL (ref 80.0–100.0)
Platelets: 157 10*3/uL (ref 150–400)
RBC: 4.15 MIL/uL (ref 3.87–5.11)
RDW: 13.5 % (ref 11.5–15.5)
WBC: 6.9 10*3/uL (ref 4.0–10.5)
nRBC: 0 % (ref 0.0–0.2)

## 2019-04-26 LAB — URINALYSIS, MICROSCOPIC (REFLEX)

## 2019-04-26 LAB — BASIC METABOLIC PANEL
Anion gap: 9 (ref 5–15)
BUN: 13 mg/dL (ref 8–23)
CO2: 28 mmol/L (ref 22–32)
Calcium: 9.2 mg/dL (ref 8.9–10.3)
Chloride: 105 mmol/L (ref 98–111)
Creatinine, Ser: 0.61 mg/dL (ref 0.44–1.00)
GFR calc Af Amer: >60 mL/min (ref >60)
GFR calc non Af Amer: >60 mL/min (ref >60)
Glucose, Bld: 123 mg/dL — ABNORMAL HIGH (ref 70–99)
Potassium: 3.9 mmol/L (ref 3.5–5.1)
Sodium: 142 mmol/L (ref 135–145)

## 2019-04-26 LAB — URINALYSIS, ROUTINE W REFLEX MICROSCOPIC
Bilirubin Urine: NEGATIVE
Glucose, UA: NEGATIVE mg/dL
Hgb urine dipstick: NEGATIVE
Ketones, ur: NEGATIVE mg/dL
Nitrite: NEGATIVE
Protein, ur: NEGATIVE mg/dL
Specific Gravity, Urine: 1.02 (ref 1.005–1.030)
pH: 7 (ref 5.0–8.0)

## 2019-04-26 LAB — CBG MONITORING, ED: Glucose-Capillary: 110 mg/dL — ABNORMAL HIGH (ref 70–99)

## 2019-04-26 MED ORDER — FENTANYL CITRATE (PF) 100 MCG/2ML IJ SOLN
50.0000 ug | Freq: Once | INTRAMUSCULAR | Status: DC
  Filled 2019-04-26: qty 2

## 2019-04-26 MED ORDER — PROPOFOL 10 MG/ML IV BOLUS
INTRAVENOUS | Status: AC | PRN
  Administered 2019-04-26: 20 mg via INTRAVENOUS
  Administered 2019-04-26 (×2): 10 mg via INTRAVENOUS

## 2019-04-26 MED ORDER — FENTANYL CITRATE (PF) 100 MCG/2ML IJ SOLN
INTRAMUSCULAR | Status: AC | PRN
  Administered 2019-04-26: 50 ug via INTRAVENOUS

## 2019-04-26 MED ORDER — SODIUM CHLORIDE 0.9% FLUSH
3.0000 mL | Freq: Once | INTRAVENOUS | Status: DC
  Filled 2019-04-26: qty 3

## 2019-04-26 MED ORDER — ASPIRIN 81 MG PO CHEW: 81.0000 mg | CHEWABLE_TABLET | Freq: Every day | ORAL | 0 refills | Status: AC

## 2019-04-26 MED ORDER — ASPIRIN 81 MG PO CHEW
81.0000 mg | CHEWABLE_TABLET | Freq: Once | ORAL | Status: AC
  Administered 2019-04-26: 81 mg via ORAL
  Filled 2019-04-26: qty 1

## 2019-04-26 MED ORDER — FENTANYL CITRATE (PF) 100 MCG/2ML IJ SOLN
50.0000 ug | Freq: Once | INTRAMUSCULAR | Status: AC
  Administered 2019-04-26: 50 ug via INTRAVENOUS
  Filled 2019-04-26: qty 2

## 2019-04-26 MED ORDER — PROPOFOL 10 MG/ML IV BOLUS
20.0000 mg | Freq: Once | INTRAVENOUS | Status: DC
  Filled 2019-04-26: qty 20

## 2019-04-26 NOTE — ED Notes (Signed)
Pt daughter called by PA for update.

## 2019-04-26 NOTE — ED Notes (Signed)
Upon attempting to get patient up and to wheelchair for discharge, pt appears very off balance. ED PA Gershon Cull made aware and notified daughter.

## 2019-04-26 NOTE — ED Notes (Signed)
Pt daughter Jocelyn Lamer [phone]

## 2019-04-26 NOTE — ED Triage Notes (Signed)
Pt reports that she became dizzy and fell. Her daughter found her on the floor. C/o L elbow pain. Denies neck or back pain, LOC.

## 2019-04-26 NOTE — Sedation Documentation (Signed)
Unable to assess pain due to sedation.  

## 2019-04-26 NOTE — Discharge Instructions (Addendum)
Please follow-up with orthopedics. Your elbow was dislocated when you fell and we put your elbow back into place. You should keep the splint in place until evaluated by orthopedic doctors at your follow up appointment.   You are also found to be in A. fib today.  This is in a regular rhythm and increases your risk of clots.  Because you are planning because of stroke.  I am placing you on aspirin which she will take once a day 81 mg.  Please follow-up with the A. fib clinic with [facility].

## 2019-04-26 NOTE — ED Provider Notes (Addendum)
[facility] EMERGENCY DEPARTMENT Provider Note   CSN: [id number] Arrival date & time: 04/26/19  [id number]     History Chief Complaint  Patient presents with  . Fall    Erica Owen is a 85 y.o. female.  HPI Patient is an 85 year old female with history of carotid artery stenosis, CKD 3, CHF, malnutrition   Patient presents today for fall that occurred immediately before arrival.  Patient states that she was at her home when she felt lightheaded and fell to the ground falling onto her left elbow.  States she has had severe 10 out of 10 pain in her left elbow since falling.  Patient states the pain was immediate onset, sharp/achy, worse with movement and touch.  Patient denies loss of consciousness states that she occasionally gets dizzy like this.  Denies any chest pain or shortness of breath.  Denies any palpitations.     Past Medical History:  Diagnosis Date  . Abnormal EKG 03/02/2015  . Acute blood loss anemia 03/02/2015  . Acute encephalopathy 03/02/2015  . Arthritis   . Carotid artery stenosis 03/06/2015  . CKD (chronic kidney disease), stage III 03/06/2015  . Demand ischemia (HCC) 03/05/2015  . Gastric ulcer   . Gastritis 03/06/2015  . Hypertension   . IDA (iron deficiency anemia)   . Thyroid disease     Patient Active Problem List   Diagnosis Date Noted  . Mild malnutrition (HCC) 04/01/2017  . Chronic diastolic heart failure (HCC) 02/23/2016  . Carotid artery stenosis 03/06/2015  . CKD (chronic kidney disease), stage III 03/06/2015  . Gastritis 03/06/2015  . Demand ischemia (HCC) 03/05/2015  . Elevated TSH 03/05/2015  . History of gastric ulcer   . Absolute anemia     Past Surgical History:  Procedure Laterality Date  . ABDOMINAL HYSTERECTOMY    . ESOPHAGOGASTRODUODENOSCOPY (EGD) WITH PROPOFOL N/A 03/03/2015   Procedure: ESOPHAGOGASTRODUODENOSCOPY (EGD) WITH PROPOFOL;  Surgeon: Beverley FiedlerJay M Pyrtle, MD;  Location: [facility];  Service: Endoscopy;  Laterality: N/A;       OB History   No obstetric history on file.     Family History  Problem Relation Age of Onset  . Lupus Mother   . Kidney failure Mother   . Heart attack Father   . Colon cancer Brother   . Pancreatic cancer Sister     Social History   Tobacco Use  . Smoking status: Former Smoker    Types: Cigarettes    Quit date: 03/09/1965    Years since quitting: 54.1  . Smokeless tobacco: Never Used  Substance Use Topics  . Alcohol use: No    Alcohol/week: 0.0 standard drinks  . Drug use: No    Home Medications Prior to Admission medications   Medication Sig Start Date End Date Taking? Authorizing Provider  acetaminophen (TYLENOL) 500 MG tablet Take 500 mg by mouth every 6 (six) hours as needed for mild pain.    [provider]  Ascorbic Acid (VITAMIN C PO) Take 500 mg by mouth daily.    [provider]  aspirin 81 MG chewable tablet Chew 1 tablet (81 mg total) by mouth daily for 14 days. 04/26/19 05/10/19  Gailen ShelterFondaw, Tahlia Deamer S, PA  atenolol (TENORMIN) 50 MG tablet Take 1 tablet Daily for BP 11/09/18   Lucky CowboyMcKeown, William, MD  Calcium Carbonate Antacid (TUMS PO) Take by mouth daily.    [provider]  IRON, FERROUS SULFATE, PO Take 65 mg by mouth daily.    [provider]  levothyroxine (SYNTHROID) 25 MCG tablet Take 1 tablet (25 mcg total) by mouth daily before breakfast. 03/10/19   Vicie Mutters, PA-C  pantoprazole (PROTONIX) 40 MG tablet Take 1 tablet for Indigestion & Heartburn 12/07/18   Unk Pinto, MD  VITAMIN D, CHOLECALCIFEROL, PO Take by mouth.    [provider]    Allergies    Patient has no known allergies.  Review of Systems   Review of Systems  Constitutional: Negative for chills and fever.  HENT: Negative for congestion.   Eyes: Negative for pain.  Respiratory: Negative for cough and shortness of breath.   Cardiovascular: Negative for chest pain and leg swelling.  Gastrointestinal: Negative for abdominal pain and  vomiting.  Genitourinary: Negative for dysuria.  Musculoskeletal: Negative for myalgias.       Left elbow pain  Skin: Negative for rash.  Neurological: Positive for light-headedness. Negative for headaches.  Hematological: Negative for adenopathy.  Psychiatric/Behavioral: Negative for agitation.    Physical Exam Updated Vital Signs BP (!) 140/98 (BP Location: Right Arm)   Pulse 96   Temp 98.4 F (36.9 C) (Oral)   Resp (!) 36   Ht 5\' 4"  (1.626 m)   Wt 46.3 kg   SpO2 99%   BMI 17.51 kg/m   Physical Exam Vitals and nursing note reviewed.  Constitutional:      General: She is in acute distress.     Appearance: Normal appearance. She is not ill-appearing.  HENT:     Head: Normocephalic and atraumatic.     Nose: Nose normal.     Mouth/Throat:     Mouth: Mucous membranes are moist.  Eyes:     General: No scleral icterus.       Right eye: No discharge.        Left eye: No discharge.     Conjunctiva/sclera: Conjunctivae normal.  Cardiovascular:     Rate and Rhythm: Normal rate and regular rhythm.     Pulses: Normal pulses.     Heart sounds: Normal heart sounds.     Comments: DP, PT and bilateral radial pulses symmetric and easily palpable Pulmonary:     Effort: Pulmonary effort is normal. No respiratory distress.     Breath sounds: No stridor. No wheezing.  Abdominal:     Palpations: Abdomen is soft.     Tenderness: There is no abdominal tenderness.  Musculoskeletal:     Cervical back: Normal range of motion and neck supple. No rigidity.     Right lower leg: No edema.     Left lower leg: No edema.     Comments: Left elbow is deformed appears radius/ulna are posteriorly displaced  No bony tenderness over joints or long bones of the upper and lower extremities.     No neck or back midline tenderness, step-off, deformity, or bruising. Able to turn head left and right 45 degrees without difficulty.  Full range of motion of upper and lower extremity joints shown after  palpation was conducted; with 5/5 symmetrical strength in upper and lower extremities. No chest wall tenderness, no facial or cranial tenderness.   Patient has intact sensation grossly in lower and upper extremities. Intact patellar and ankle reflexes. Patient able to ambulate without difficulty.  Radial and DP pulses palpated BL.   Skin:    General: Skin is warm and dry.     Capillary Refill: Capillary refill takes less than 2 seconds.  Neurological:     Mental Status: She is alert and oriented to  person, place, and time. Mental status is at baseline.     Comments: Sensation intact C5-C6-C7 C8-T1   Psychiatric:        Mood and Affect: Mood normal.        Behavior: Behavior normal.      ED Results / Procedures / Treatments   Labs (all labs ordered are listed, but only abnormal results are displayed) Labs Reviewed  BASIC METABOLIC PANEL - Abnormal; Notable for the following components:      Result Value   Glucose, Bld 123 (*)    All other components within normal limits  URINALYSIS, ROUTINE W REFLEX MICROSCOPIC - Abnormal; Notable for the following components:   Leukocytes,Ua TRACE (*)    All other components within normal limits  URINALYSIS, MICROSCOPIC (REFLEX) - Abnormal; Notable for the following components:   Bacteria, UA RARE (*)    All other components within normal limits  CBG MONITORING, ED - Abnormal; Notable for the following components:   Glucose-Capillary 110 (*)    All other components within normal limits  URINE CULTURE  CBC    EKG EKG Interpretation  Date/Time:  Sunday April 26 2019 14:51:55 EST Ventricular Rate:  128 PR Interval:    QRS Duration: 77 QT Interval:  289 QTC Calculation: 422 R Axis:   57 Text Interpretation: Atrial fibrillation LVH with secondary repolarization abnormality ST depression, probably rate related Otherwise no significant change Confirmed by Melene Plan [phone]) on 04/26/2019 2:55:51 PM   Radiology DG Elbow 2 Views  Left  Result Date: 04/26/2019 CLINICAL DATA:  Post reduction film of a dislocated elbow. EXAM: LEFT ELBOW - 2 VIEW COMPARISON:  April 26, 2019 FINDINGS: The previously noted dislocation of the left elbow has been reduced in good position. No definite fracture is noted. IMPRESSION: The previously noted dislocation of the left elbow has been reduced in good position. Electronically Signed   By: Sherian Rein M.D.   On: 04/26/2019 12:24   DG Elbow Complete Left  Result Date: 04/26/2019 CLINICAL DATA:  Status post fall today with deformity of left elbow. EXAM: LEFT ELBOW - COMPLETE 3+ VIEW COMPARISON:  None. FINDINGS: There is anterior dislocation of the distal humerus relative to the proximal ulna and radius. No acute fracture is noted. IMPRESSION: Left elbow dislocation is described. Electronically Signed   By: Sherian Rein M.D.   On: 04/26/2019 10:39   CT Head Wo Contrast  Result Date: 04/26/2019 CLINICAL DATA:  85 year old female with dizziness and recent fall today EXAM: CT HEAD WITHOUT CONTRAST TECHNIQUE: Contiguous axial images were obtained from the base of the skull through the vertex without intravenous contrast. COMPARISON:  Prior head CT 03/02/2015 FINDINGS: Brain: No evidence of acute infarction, hemorrhage, hydrocephalus, extra-axial collection or mass lesion/mass effect. Mild cerebral cortical volume loss. Patchy areas of periventricular, subcortical and deep white matter hypoattenuation are nonspecific but most consistent with chronic microvascular ischemic white matter disease. Vascular: No hyperdense vessel or unexpected calcification. Skull: Normal. Negative for fracture or focal lesion. Sinuses/Orbits: No acute finding. Other: None. IMPRESSION: 1. No acute intracranial abnormality. 2. Moderate chronic microvascular ischemic white matter changes. Electronically Signed   By: Malachy Moan M.D.   On: 04/26/2019 13:54    Procedures Reduction of dislocation  Date/Time:  04/26/2019 6:59 PM Performed by: Gailen Shelter, PA Authorized by: Gailen Shelter, PA  Consent: Verbal consent obtained. Risks and benefits: risks, benefits and alternatives were discussed Consent given by: patient Patient understanding: patient states understanding of the procedure  being performed Patient consent: the patient's understanding of the procedure matches consent given Procedure consent: procedure consent matches procedure scheduled Relevant documents: relevant documents present and verified Test results: test results available and properly labeled Imaging studies: imaging studies available Patient identity confirmed: verbally with patient Time out: Immediately prior to procedure a "time out" was called to verify the correct patient, procedure, equipment, support staff and site/side marked as required. Local anesthesia used: no  Anesthesia: Local anesthesia used: no  Sedation: Patient sedated: yes Sedation type: moderate (conscious) sedation Sedatives: propofol Analgesia: fentanyl  Patient tolerance: patient tolerated the procedure well with no immediate complications Comments: Procedural sedation conducted by Dr. Adela Lank.    (including critical care time)  CRITICAL CARE Performed by: Gailen Shelter   Total critical care time: 31 minutes  Critical care time was exclusive of separately billable procedures and treating other patients.  Critical care was necessary to treat or prevent imminent or life-threatening deterioration.  Critical care was time spent personally by me on the following activities: development of treatment plan with patient and/or surrogate as well as nursing, discussions with consultants, evaluation of patient's response to treatment, examination of patient, obtaining history from patient or surrogate, ordering and performing treatments and interventions, ordering and review of laboratory studies, ordering and review of radiographic studies,  pulse oximetry and re-evaluation of patient's condition.  Patient with new onset A. fib with tachycardia lasting more than 60 minutes.  Patient was in emergency department for over 6 hours as a result of her new onset A. fib.  She was symptomatic and likely fell today because of her A. fib.  Was not rate controlled however was tachycardic for approximately 1 hour.  Patient is symptomatic had lightheadedness A. Fib.  Patient is anticoagulation in the form of aspirin and will follow up with A. fib clinic.  SPLINT APPLICATION Date/Time: 7:13 PM Authorized by: Gailen Shelter Consent: Verbal consent obtained. Risks and benefits: risks, benefits and alternatives were discussed Consent given by: patient Splint applied by:  technician Location details: left arm Splint type: long arm posterior Supplies used: ortho glass Post-procedure: The splinted body part was neurovascularly unchanged following the procedure. Patient tolerance: Patient tolerated the procedure well with no immediate complications.   Medications Ordered in ED Medications  fentaNYL (SUBLIMAZE) injection 50 mcg (50 mcg Intravenous Given 04/26/19 1055)  fentaNYL (SUBLIMAZE) injection (50 mcg Intravenous Given 04/26/19 1157)  propofol (DIPRIVAN) 10 mg/mL bolus/IV push (10 mg Intravenous Given 04/26/19 1200)  aspirin chewable tablet 81 mg (81 mg Oral Given 04/26/19 1543)    ED Course  I have reviewed the triage vital signs and the nursing notes.  Pertinent labs & imaging results that were available during my care of the patient were reviewed by me and considered in my medical decision making (see chart for details).  Clinical Course as of Apr 25 1912  Wynelle Link Apr 26, 2019  1303 Nursing noted pt is very unsteady on her feet. Discussed with daughter on the phone. Daughter concerned that patient has been unsteady on her feet today. Pt continues to be neuro-intact on my exam however it is limited d/t left arm injury. Will do CT w/o of  head to ro venous injury/acute bleed and DC if normal.   [WF]    Clinical Course User Index [WF] Gailen Shelter, PA   MDM Rules/Calculators/A&P                      Patient is  85 year old female presented today after fall with no loss of consciousness and no head injury where she dislocated her left elbow.  Blood work within normal limits no anemia, hypoglycemia, hyperglycemia EKG nonischemic with no facing QT prolongation or PR shortening.  Patient denies any chest pain abdominal pain or shortness of breath.  She denies any leg pain or swelling in her legs.  Patient does has history of CHF however she has preserved ejection fraction of her past echo that was not recent however she also does not have any symptoms of fluid overload.  I suspect that patient's dizziness which she states happens occasionally is not related to CHF.  Patient also has no red flag symptoms on history and reassuring physical exam apart from her elbow dislocation.  Given fentanyl for pain.   Elbow reduction conducted with Dr. Adela Lank. Propofol and fentanyl used.  Reduction was successful.  Splint was placed patient will follow up with orthopedics.  Postreduction film shows relocated joint.  Patient has moderate hematoma on her elbow after reduction.  Post-reduction Patient on monitor noted to be in irregular rhythm. Repeat EKG now in afib RVR rate in low 100s on my evaluation. Discussed with patient. She would prefer discharge home.   Patient with reassuring blood work no leukocytosis no hyper or hypoglycemia no evidence of AKI.  Electrolytes are within normal limits.  Initial EKG within normal limits however repeat EKG done due to patient becoming tachycardic shows A. fib RVR.  She has a history of A. fib although she has had abnormal EKGs in the past they were not because of an irregular rhythm.  Patient seen by cardiology in the past once 4 years ago and had an echo that showed retained EF within normal limits.  Patient  takes beta-blocker at home.  Patient's pulse is approximately 100-110 while in the ED.  She is stable.  She does not have any lightheadedness or dizziness.  I discussed at length with daughter and with patient increased risk of stroke when in A. fib.  She is understanding of this I recommended admission for further work-up and evaluation in the hospital.  Daughter and patient declined admission for to be sent home.  Patient has CHA2DS2-VASc score of 4.  Will place on aspirin 81 mg given first dose here in the ED and recommended that she take it daily.  She will follow-up with A. Fib clinic.   I discussed my concern with daughter and patient that she give eventually follow again as suspect that her initial follow-up was due to paroxysmal A. fib.  They are understanding of this and would still like to have her discharged to home.  3:21 PM --shared decision-making conversation with Erica Owen and patient Erica Owen is daughter of patient.  Patient would prefer to be discharged home was offered admission to hospital but declined.   DC with respiratory rate WNL on my evaluation (noted to be 36 however this was not noted on my exam). HR is 96 on discharge still in afib.   I discussed this case with my attending physician who cosigned this note including patient's presenting symptoms, physical exam, and planned diagnostics and interventions. Attending physician stated agreement with plan or made changes to plan which were implemented.   Attending physician assessed patient at bedside.    This patient appears reasonably screened and I doubt any other medical condition requiring further workup, evaluation, or treatment in the ED at this time prior to discharge.   Patient's vitals are  WNL apart from vital sign abnormalities discussed above, patient is in NAD, and able to ambulate in the ED at their baseline. Pain has been managed or a plan has been made for home management and has no complaints prior to discharge.  Patient is comfortable with above plan and is stable for discharge at this time. All questions were answered prior to disposition. Results from the ER workup discussed with the patient face to face and all questions answered to the best of my ability. The patient is safe for discharge with strict return precautions. Patient appears safe for discharge with appropriate follow-up. Conveyed my impression with the patient and they voiced understanding and are agreeable to plan.   An After Visit Summary was printed and given to the patient.  Portions of this note were generated with Scientist, clinical (histocompatibility and immunogenetics). Dictation errors may occur despite best attempts at proofreading.    Final Clinical Impression(s) / ED Diagnoses Final diagnoses:  Dislocation of left elbow, initial encounter  Atrial fibrillation, unspecified type [facility])    Rx / DC Orders ED Discharge Orders         Ordered    Amb referral to AFIB Clinic     04/26/19 1530    aspirin 81 MG chewable tablet  Daily     04/26/19 1534           Gailen Shelter, [place] 04/26/19 1914    Gailen Shelter, [place] 04/26/19 1920    Melene Plan, DO 04/28/19 [phone]

## 2019-04-26 NOTE — ED Notes (Signed)
Pt d/c with daughter

## 2019-04-26 NOTE — ED Notes (Signed)
Placed End tidal CO2.  Ambu at [facility], suction in place.

## 2019-04-26 NOTE — Sedation Documentation (Signed)
Pt denies pain.

## 2019-04-27 LAB — URINE CULTURE: Culture: 30000 — AB

## 2019-04-28 ENCOUNTER — Telehealth: Payer: Self-pay | Admitting: Internal Medicine

## 2019-04-28 ENCOUNTER — Ambulatory Visit: Payer: Medicare Other | Admitting: Adult Health Nurse Practitioner

## 2019-04-28 ENCOUNTER — Telehealth: Payer: Self-pay

## 2019-04-28 DIAGNOSIS — M25522 Pain in left elbow: Secondary | ICD-10-CM | POA: Diagnosis not present

## 2019-04-28 NOTE — Telephone Encounter (Signed)
No treatment for UC from ED 04/26/2019 per Benedetto Goad PA

## 2019-04-28 NOTE — Progress Notes (Signed)
ED Antimicrobial Stewardship Positive Culture Follow Up   Erica Owen is an 85 y.o. female who presented to [facility] on 04/26/2019 with a chief complaint of  Chief Complaint  Patient presents with   Fall    Recent Results (from the past 74 hour(s))  Urine culture     Status: Abnormal   Collection Time: 04/26/19  1:08 PM   Specimen: Urine, Random  Result Value Ref Range Status   Specimen Description   Final    URINE, RANDOM Performed at [facility], [address] [place], [place] [address]    Special Requests   Final    NONE Performed at [facility], [address] [place], [place] [address]    Culture (A)  Final    30,000 COLONIES/mL GROUP B STREP(S.AGALACTIAE)ISOLATED TESTING AGAINST S. AGALACTIAE NOT ROUTINELY PERFORMED DUE TO PREDICTABILITY OF AMP/PEN/VAN SUSCEPTIBILITY. Performed at [facility], [place] [address] [place], [place] [address]    Report Status 04/27/2019 FINAL  Final    [x]  Patient discharged originally without antimicrobial agent  New antibiotic prescription: No further treatment indicated for asymptomatic bacteriuria.  ED Provider: Benedetto Goad, PA-C   [name] 04/28/2019, 7:58 AM Clinical Pharmacist Monday - Friday phone -  [phone] Saturday - Sunday phone - [phone]

## 2019-04-28 NOTE — Telephone Encounter (Signed)
Daughter called and advised patient's left hand and fingers have begun to swell and are now bruised. This is a new issue. Per Danton Sewer- go directly to ortho for immediate evaluation.  We contacted [facility]  - rescheduled patient's initial appointment and was seen  today @ 1:30pm. We will reschedule patient for ED follow up with our office.

## 2019-04-28 NOTE — Progress Notes (Deleted)
Hospital follow up  Assessment and Plan: Hospital visit follow up for   All medications were reviewed with patient and family and fully reconciled. All questions answered fully, and patient and family members were encouraged to call the office with any further questions or concerns. Discussed goal to avoid readmission related to this diagnosis.  There are no discontinued medications.  CAN NOT DO FOR BCBS REGULAR OR MEDICARE Over 40 minutes of exam, counseling, chart review, and complex, high/moderate level critical decision making was performed this visit.   Future Appointments  Date Time Provider Department Center  04/28/2019  2:00 PM Elder Negus, NP [facility] None  04/29/2019  2:30 PM Newman Nip, NP [facility] None  05/25/2019  2:30 PM Lucky Cowboy, MD [facility] None  11/03/2019  2:00 PM [name], PA-C [facility] None  03/15/2020  3:00 PM [name], PA-C [facility] None     HPI 85 y.o.female presents for follow up for transition from recent hospitalization or SNIF stay. Admit date to the hospital was 04/26/19, patient was discharged from the hospital on 04/26/19 and our clinical staff contacted the office the day after discharge to set up a follow up appointment. The discharge summary, medications, and diagnostic test results were reviewed before meeting with the patient. The patient was admitted for: Dislocation of lef elbow, initial encounter  Patient is 85 year old female presented today after fall with no loss of consciousness and no head injury where she dislocated her left elbow.  Blood work within normal limits no anemia, hypoglycemia, hyperglycemia EKG nonischemic with no facing QT prolongation or PR shortening.  Patient denies any chest pain abdominal pain or shortness of breath.  She denies any leg pain or swelling in her legs.  Patient does has history of CHF however she has preserved ejection fraction of her past echo that was not recent however she  also does not have any symptoms of fluid overload.  I suspect that patient's dizziness which she states happens occasionally is not related to CHF.  Patient also has no red flag symptoms on history and reassuring physical exam apart from her elbow dislocation.  Given fentanyl for pain.   Elbow reduction conducted with Dr. Adela Lank. Propofol and fentanyl used.  Reduction was successful.  Splint was placed patient will follow up with orthopedics.  Postreduction film shows relocated joint.  Patient has moderate hematoma on her elbow after reduction.  Post-reduction Patient on monitor noted to be in irregular rhythm. Repeat EKG now in afib RVR rate in low 100s on my evaluation. Discussed with patient. She would prefer discharge home.   Patient with reassuring blood work no leukocytosis no hyper or hypoglycemia no evidence of AKI.  Electrolytes are within normal limits.  Initial EKG within normal limits however repeat EKG done due to patient becoming tachycardic shows A. fib RVR.  She has a history of A. fib although she has had abnormal EKGs in the past they were not because of an irregular rhythm.  Patient seen by cardiology in the past once 4 years ago and had an echo that showed retained EF within normal limits.  Patient takes beta-blocker at home.  Patient's pulse is approximately 100-110 while in the ED.  She is stable.  She does not have any lightheadedness or dizziness.  I discussed at length with daughter and with patient increased risk of stroke when in A. fib.  She is understanding of this I recommended admission for further work-up and evaluation in the hospital.  Daughter  and patient declined admission for to be sent home.  Patient has CHA2DS2-VASc score of 4.  Will place on aspirin 81 mg given first dose here in the ED and recommended that she take it daily.  She will follow-up with A. Fib clinic.   I discussed my concern with daughter and patient that she give eventually follow again as  suspect that her initial follow-up was due to paroxysmal A. fib.  They are understanding of this and would still like to have her discharged to home.  3:21 PM --shared decision-making conversation with Jocelyn Lamer and patient Jocelyn Lamer is daughter of patient.  Patient would prefer to be discharged home was offered admission to hospital but declined.   DC with respiratory rate WNL on my evaluation (noted to be 36 however this was not noted on my exam). HR is 96 on discharge still in afib.   Patient to follow up with Sophronia Simas and Afib clinic. Recommended to start bASA which she has started and continued since discharge.   Home health {ACTION; IS/IS [id number]} involved.   Images while in the hospital: DG Elbow 2 Views Left  Result Date: 04/26/2019 CLINICAL DATA:  Post reduction film of a dislocated elbow. EXAM: LEFT ELBOW - 2 VIEW COMPARISON:  April 26, 2019 FINDINGS: The previously noted dislocation of the left elbow has been reduced in good position. No definite fracture is noted. IMPRESSION: The previously noted dislocation of the left elbow has been reduced in good position. Electronically Signed   By: Abelardo Diesel M.D.   On: 04/26/2019 12:24   DG Elbow Complete Left  Result Date: 04/26/2019 CLINICAL DATA:  Status post fall today with deformity of left elbow. EXAM: LEFT ELBOW - COMPLETE 3+ VIEW COMPARISON:  None. FINDINGS: There is anterior dislocation of the distal humerus relative to the proximal ulna and radius. No acute fracture is noted. IMPRESSION: Left elbow dislocation is described. Electronically Signed   By: Abelardo Diesel M.D.   On: 04/26/2019 10:39   CT Head Wo Contrast  Result Date: 04/26/2019 CLINICAL DATA:  85 year old female with dizziness and recent fall today EXAM: CT HEAD WITHOUT CONTRAST TECHNIQUE: Contiguous axial images were obtained from the base of the skull through the vertex without intravenous contrast. COMPARISON:  Prior head CT 03/02/2015 FINDINGS: Brain: No evidence  of acute infarction, hemorrhage, hydrocephalus, extra-axial collection or mass lesion/mass effect. Mild cerebral cortical volume loss. Patchy areas of periventricular, subcortical and deep white matter hypoattenuation are nonspecific but most consistent with chronic microvascular ischemic white matter disease. Vascular: No hyperdense vessel or unexpected calcification. Skull: Normal. Negative for fracture or focal lesion. Sinuses/Orbits: No acute finding. Other: None. IMPRESSION: 1. No acute intracranial abnormality. 2. Moderate chronic microvascular ischemic white matter changes. Electronically Signed   By: Jacqulynn Cadet M.D.   On: 04/26/2019 13:54     Current Outpatient Medications (Endocrine & Metabolic):  .  levothyroxine (SYNTHROID) 25 MCG tablet, Take 1 tablet (25 mcg total) by mouth daily before breakfast.  Current Outpatient Medications (Cardiovascular):  .  atenolol (TENORMIN) 50 MG tablet, Take 1 tablet Daily for BP   Current Outpatient Medications (Analgesics):  .  acetaminophen (TYLENOL) 500 MG tablet, Take 500 mg by mouth every 6 (six) hours as needed for mild pain. Marland Kitchen  aspirin 81 MG chewable tablet, Chew 1 tablet (81 mg total) by mouth daily for 14 days.  Current Outpatient Medications (Hematological):  Marland Kitchen  IRON, FERROUS SULFATE, PO, Take 65 mg by mouth daily.  Current Outpatient Medications (Other):  .  Ascorbic Acid (VITAMIN C PO), Take 500 mg by mouth daily. .  Calcium Carbonate Antacid (TUMS PO), Take by mouth daily. .  pantoprazole (PROTONIX) 40 MG tablet, Take 1 tablet for Indigestion & Heartburn .  VITAMIN D, CHOLECALCIFEROL, PO, Take by mouth.  Past Medical History:  Diagnosis Date  . Abnormal EKG 03/02/2015  . Acute blood loss anemia 03/02/2015  . Acute encephalopathy 03/02/2015  . Arthritis   . Carotid artery stenosis 03/06/2015  . CKD (chronic kidney disease), stage III 03/06/2015  . Demand ischemia (HCC) 03/05/2015  . Gastric ulcer   . Gastritis 03/06/2015  .  Hypertension   . IDA (iron deficiency anemia)   . Thyroid disease      No Known Allergies  ROS: all negative except above.   Physical Exam: There were no vitals filed for this visit. There were no vitals taken for this visit. General Appearance: Well nourished, in no apparent distress. Eyes: PERRLA, EOMs, conjunctiva no swelling or erythema Sinuses: No Frontal/maxillary tenderness ENT/Mouth: Ext aud canals clear, TMs without erythema, bulging. No erythema, swelling, or exudate on post pharynx.  Tonsils not swollen or erythematous. Hearing normal.  Neck: Supple, thyroid normal.  Respiratory: Respiratory effort normal, BS equal bilaterally without rales, rhonchi, wheezing or stridor.  Cardio: RRR with no MRGs. Brisk peripheral pulses without edema.  Abdomen: Soft, + BS.  Non tender, no guarding, rebound, hernias, masses. Lymphatics: Non tender without lymphadenopathy.  Musculoskeletal: Full ROM, 5/5 strength, normal gait.  Skin: Warm, dry without rashes, lesions, ecchymosis.  Neuro: Cranial nerves intact. Normal muscle tone, no cerebellar symptoms. Sensation intact.  Psych: Awake and oriented X 3, normal affect, Insight and Judgment appropriate.     Elder NegusKyra Emaly Boschert, NP 12:04 AM [facility]

## 2019-04-29 ENCOUNTER — Ambulatory Visit (HOSPITAL_COMMUNITY)
Admission: RE | Admit: 2019-04-29 | Discharge: 2019-04-29 | Disposition: A | Discharge status: Home / Self Care | Payer: Medicare Other | Source: Ambulatory Visit | Attending: Nurse Practitioner | Admitting: Nurse Practitioner

## 2019-04-29 ENCOUNTER — Other Ambulatory Visit: Payer: Self-pay

## 2019-04-29 VITALS — BP 198/100 | HR 62 | Ht 64.0 in | Wt 99.6 lb

## 2019-04-29 DIAGNOSIS — I130 Hypertensive heart and chronic kidney disease with heart failure and stage 1 through stage 4 chronic kidney disease, or unspecified chronic kidney disease: Secondary | ICD-10-CM | POA: Diagnosis not present

## 2019-04-29 DIAGNOSIS — Z800 Family history of malignant neoplasm of digestive organs: Secondary | ICD-10-CM | POA: Insufficient documentation

## 2019-04-29 DIAGNOSIS — D509 Iron deficiency anemia, unspecified: Secondary | ICD-10-CM | POA: Insufficient documentation

## 2019-04-29 DIAGNOSIS — Z79899 Other long term (current) drug therapy: Secondary | ICD-10-CM | POA: Diagnosis not present

## 2019-04-29 DIAGNOSIS — E079 Disorder of thyroid, unspecified: Secondary | ICD-10-CM | POA: Diagnosis not present

## 2019-04-29 DIAGNOSIS — Z7982 Long term (current) use of aspirin: Secondary | ICD-10-CM | POA: Diagnosis not present

## 2019-04-29 DIAGNOSIS — Z79890 Hormone replacement therapy (postmenopausal): Secondary | ICD-10-CM | POA: Insufficient documentation

## 2019-04-29 DIAGNOSIS — Z841 Family history of disorders of kidney and ureter: Secondary | ICD-10-CM | POA: Insufficient documentation

## 2019-04-29 DIAGNOSIS — I5032 Chronic diastolic (congestive) heart failure: Secondary | ICD-10-CM | POA: Insufficient documentation

## 2019-04-29 DIAGNOSIS — R9431 Abnormal electrocardiogram [ECG] [EKG]: Secondary | ICD-10-CM | POA: Insufficient documentation

## 2019-04-29 DIAGNOSIS — I480 Paroxysmal atrial fibrillation: Secondary | ICD-10-CM

## 2019-04-29 DIAGNOSIS — Z8249 Family history of ischemic heart disease and other diseases of the circulatory system: Secondary | ICD-10-CM | POA: Diagnosis not present

## 2019-04-29 DIAGNOSIS — Z90710 Acquired absence of both cervix and uterus: Secondary | ICD-10-CM | POA: Insufficient documentation

## 2019-04-29 DIAGNOSIS — Z87891 Personal history of nicotine dependence: Secondary | ICD-10-CM | POA: Diagnosis not present

## 2019-04-29 DIAGNOSIS — M1990 Unspecified osteoarthritis, unspecified site: Secondary | ICD-10-CM | POA: Diagnosis not present

## 2019-04-29 DIAGNOSIS — I6529 Occlusion and stenosis of unspecified carotid artery: Secondary | ICD-10-CM | POA: Insufficient documentation

## 2019-04-29 DIAGNOSIS — N183 Chronic kidney disease, stage 3 unspecified: Secondary | ICD-10-CM | POA: Insufficient documentation

## 2019-04-29 DIAGNOSIS — I4891 Unspecified atrial fibrillation: Secondary | ICD-10-CM | POA: Insufficient documentation

## 2019-04-29 MED ORDER — ATENOLOL 50 MG PO TABS: ORAL_TABLET | ORAL | 1 refills | Status: DC

## 2019-04-29 NOTE — Patient Instructions (Addendum)
Atenolol add 1/2 tablet at bedtime Wear Zio patch for 2 weeks

## 2019-04-29 NOTE — Progress Notes (Signed)
Assessment and Plan:  Paroxysmal atrial fibrillation (Erica Owen) Continue follow up with cardiology  NSR today Has ZIO patch on now to find out disease burden We would prefer if she continues on bASA due to history of GI bleeds Check TSH and recheck urine for infection  -     atenolol (TENORMIN) 50 MG tablet; Take 1 tablet in the am and 1/2 tablet at bedtime -     For home use only DME 4 wheeled rolling walker with seat ([id number])  Dizziness -     For home use only DME 4 wheeled rolling walker with seat ([id number]) Improved but still with unsteady gait will send in walker  Dislocation of left elbow, subsequent encounter Continue follow up ortho Good ROM and strength in hand today  Hypothyroidism, unspecified type -     TSH Hypothyroidism-check TSH level, continue medications the same, reminded to take on an empty stomach 30-104mins before food.   Abnormal urinalysis -     Urinalysis, Routine w reflex microscopic -     Culture, Urine - likely contaminate but will recheck today   HPI 85 y.o.female with history of has History of gastric ulcer; Absolute anemia; Demand ischemia ([place]); Elevated TSH; Carotid artery stenosis; CKD (chronic kidney disease), stage III; Gastritis; Chronic diastolic heart failure ([place]); Mild malnutrition ([place]); and Paroxysmal atrial fibrillation (HCC) on their problem list. presents for follow up from the ER. Patient went to the ER on 04/26/19, for:   S/p fall with elbow dislocation and Afib with RVR.  Her elbow was reduced in the ER with a good post reduction film. She called our office with bruising and pain left hand/fingers and saw ortho yesterday. Now in hard splint, and doing ROM exercises.  She has follow up with the Afib clinic pm 12/30, she is not on anticoagulation other than bASA due to age, has ZIO patch to see afib burden and make more informed desicision. She is on metoprolol 50 mg and 25 mg at night.  BP: 118/72   She was started on 25 mcg synthroid  last visit. Will recheck today, make sure she is not in hyperthyroid range.  Lab Results  Component Value Date   TSH 4.66 (H) 03/04/2019   She had a urine culture in the hospital that showed 30,000 colonies, she is not having any symptoms, will recheck urine, likely contaminate.   DG Elbow 2 Views Left  Result Date: 04/26/2019 CLINICAL DATA:  Post reduction film of a dislocated elbow. EXAM: LEFT ELBOW - 2 VIEW COMPARISON:  April 26, 2019 FINDINGS: The previously noted dislocation of the left elbow has been reduced in good position. No definite fracture is noted. IMPRESSION: The previously noted dislocation of the left elbow has been reduced in good position. Electronically Signed   By: Abelardo Diesel M.D.   On: 04/26/2019 12:24   DG Elbow Complete Left  Result Date: 04/26/2019 CLINICAL DATA:  Status post fall today with deformity of left elbow. EXAM: LEFT ELBOW - COMPLETE 3+ VIEW COMPARISON:  None. FINDINGS: There is anterior dislocation of the distal humerus relative to the proximal ulna and radius. No acute fracture is noted. IMPRESSION: Left elbow dislocation is described. Electronically Signed   By: Abelardo Diesel M.D.   On: 04/26/2019 10:39   CT Head Wo Contrast  Result Date: 04/26/2019 CLINICAL DATA:  85 year old female with dizziness and recent fall today EXAM: CT HEAD WITHOUT CONTRAST TECHNIQUE: Contiguous axial images were obtained from the base of the skull through the  vertex without intravenous contrast. COMPARISON:  Prior head CT 03/02/2015 FINDINGS: Brain: No evidence of acute infarction, hemorrhage, hydrocephalus, extra-axial collection or mass lesion/mass effect. Mild cerebral cortical volume loss. Patchy areas of periventricular, subcortical and deep white matter hypoattenuation are nonspecific but most consistent with chronic microvascular ischemic white matter disease. Vascular: No hyperdense vessel or unexpected calcification. Skull: Normal. Negative for fracture or focal  lesion. Sinuses/Orbits: No acute finding. Other: None. IMPRESSION: 1. No acute intracranial abnormality. 2. Moderate chronic microvascular ischemic white matter changes. Electronically Signed   By: Malachy Moan M.D.   On: 04/26/2019 13:54     Past Medical History:  Diagnosis Date  . Abnormal EKG 03/02/2015  . Acute blood loss anemia 03/02/2015  . Acute encephalopathy 03/02/2015  . Arthritis   . Carotid artery stenosis 03/06/2015  . CKD (chronic kidney disease), stage III 03/06/2015  . Demand ischemia (HCC) 03/05/2015  . Gastric ulcer   . Gastritis 03/06/2015  . Hypertension   . IDA (iron deficiency anemia)   . Thyroid disease      No Known Allergies    Current Outpatient Medications on File Prior to Visit  Medication Sig Dispense Refill  . acetaminophen (TYLENOL) 500 MG tablet Take 500 mg by mouth every 6 (six) hours as needed for mild pain.    . Ascorbic Acid (VITAMIN C PO) Take 500 mg by mouth daily.    Marland Kitchen aspirin 81 MG chewable tablet Chew 1 tablet (81 mg total) by mouth daily for 14 days. 14 tablet 0  . atenolol (TENORMIN) 50 MG tablet Take 1 tablet in the am and 1/2 tablet at bedtime 90 tablet 1  . IRON, FERROUS SULFATE, PO Take 65 mg by mouth daily.    Marland Kitchen levothyroxine (SYNTHROID) 25 MCG tablet Take 1 tablet (25 mcg total) by mouth daily before breakfast. 90 tablet 1  . pantoprazole (PROTONIX) 40 MG tablet Take 1 tablet for Indigestion & Heartburn 90 tablet 1  . VITAMIN D, CHOLECALCIFEROL, PO Take by mouth.     No current facility-administered medications on file prior to visit.    ROS: all negative except above.   Physical Exam: Filed Weights   05/04/19 0955  Weight: 104 lb (47.2 kg)   BP 118/72   Pulse 66   Temp (!) 97.5 F (36.4 C)   Wt 104 lb (47.2 kg)   SpO2 95%   BMI 17.85 kg/m  General Appearance: Well nourished, in no apparent distress. Eyes: PERRLA, EOMs, conjunctiva no swelling or erythema Sinuses: No Frontal/maxillary tenderness ENT/Mouth: Ext aud  canals clear, TMs without erythema, bulging. No erythema, swelling, or exudate on post pharynx.  Tonsils not swollen or erythematous. Hearing normal.  Neck: Supple, thyroid normal.  Respiratory: Respiratory effort normal, BS equal bilaterally without rales, rhonchi, wheezing or stridor.  Cardio: RRR with no MRGs. Brisk peripheral pulses without edema.  Abdomen: Soft, + BS.  Non tender, no guarding, rebound, hernias, masses. Lymphatics: Non tender without lymphadenopathy.  Musculoskeletal: Full ROM except left elbow, still in hard splint, left hand with healing ecchymosis, good sensation and good grip, 4/5 strength, unsteady gait Skin: Warm, dry without rashes, lesions, ecchymosis.  Neuro: Cranial nerves intact. Normal muscle tone, no cerebellar symptoms.  Psych: Awake and oriented X 3, normal affect, Insight and Judgment appropriate.     [name], PA-C 9:58 AM [facility]

## 2019-04-30 ENCOUNTER — Encounter (HOSPITAL_COMMUNITY): Payer: Self-pay | Admitting: Nurse Practitioner

## 2019-04-30 NOTE — Progress Notes (Signed)
Primary Care Physician: Unk Pinto, MD Referring Physician: [facility] ER f/u   Erica Owen is a 85 y.o. female with a h/o carotid artery disease, chronic diastolic heart failure, history of gastric ulcer and in the afib clinic for f/u of a brief episode of afib noted in the ER while pt was being treated for fall with fx of left elbow. She left in SR. She was placed on ASA and sent here. Has never been told she has a h/o of afib. Never aware of irregular heart beat. Has a h/o unsteady gait but this is her first fall. She states that dizziness contributed to her fall. Lives with her daughter, with her today. She is in SR today. .   Today, she denies symptoms of palpitations, chest pain, shortness of breath, orthopnea, PND, lower extremity edema, dizziness, presyncope, syncope, or neurologic sequela. The patient is tolerating medications without difficulties and is otherwise without complaint today.   Past Medical History:  Diagnosis Date  . Abnormal EKG 03/02/2015  . Acute blood loss anemia 03/02/2015  . Acute encephalopathy 03/02/2015  . Arthritis   . Carotid artery stenosis 03/06/2015  . CKD (chronic kidney disease), stage III 03/06/2015  . Demand ischemia ([place]) 03/05/2015  . Gastric ulcer   . Gastritis 03/06/2015  . Hypertension   . IDA (iron deficiency anemia)   . Thyroid disease    Past Surgical History:  Procedure Laterality Date  . ABDOMINAL HYSTERECTOMY    . ESOPHAGOGASTRODUODENOSCOPY (EGD) WITH PROPOFOL N/A 03/03/2015   Procedure: ESOPHAGOGASTRODUODENOSCOPY (EGD) WITH PROPOFOL;  Surgeon: Jerene Bears, MD;  Location: [facility] ENDOSCOPY;  Service: Endoscopy;  Laterality: N/A;    Current Outpatient Medications  Medication Sig Dispense Refill  . acetaminophen (TYLENOL) 500 MG tablet Take 500 mg by mouth every 6 (six) hours as needed for mild pain.    . Ascorbic Acid (VITAMIN C PO) Take 500 mg by mouth daily.    Marland Kitchen aspirin 81 MG chewable tablet Chew 1 tablet (81 mg total) by mouth daily for  14 days. 14 tablet 0  . atenolol (TENORMIN) 50 MG tablet Take 1 tablet in the am and 1/2 tablet at bedtime 90 tablet 1  . IRON, FERROUS SULFATE, PO Take 65 mg by mouth daily.    Marland Kitchen levothyroxine (SYNTHROID) 25 MCG tablet Take 1 tablet (25 mcg total) by mouth daily before breakfast. 90 tablet 1  . pantoprazole (PROTONIX) 40 MG tablet Take 1 tablet for Indigestion & Heartburn 90 tablet 1  . VITAMIN D, CHOLECALCIFEROL, PO Take by mouth.     No current facility-administered medications for this encounter.    No Known Allergies  Social History   Socioeconomic History  . Marital status: Widowed    Spouse name: Not on file  . Number of children: 2  . Years of education: Not on file  . Highest education level: Not on file  Occupational History  . Occupation: retired  Tobacco Use  . Smoking status: Former Smoker    Types: Cigarettes    Quit date: 03/09/1965    Years since quitting: 54.1  . Smokeless tobacco: Never Used  Substance and Sexual Activity  . Alcohol use: No    Alcohol/week: 0.0 standard drinks  . Drug use: No  . Sexual activity: Never  Other Topics Concern  . Not on file  Social History Narrative  . Not on file   Social Determinants of Health   Financial Resource Strain:   . Difficulty of Paying Living Expenses: Not  on file  Food Insecurity:   . Worried About Programme researcher, broadcasting/film/video in the Last Year: Not on file  . Ran Out of Food in the Last Year: Not on file  Transportation Needs:   . Lack of Transportation (Medical): Not on file  . Lack of Transportation (Non-Medical): Not on file  Physical Activity:   . Days of Exercise per Week: Not on file  . Minutes of Exercise per Session: Not on file  Stress:   . Feeling of Stress : Not on file  Social Connections:   . Frequency of Communication with Friends and Family: Not on file  . Frequency of Social Gatherings with Friends and Family: Not on file  . Attends Religious Services: Not on file  . Active Member of Clubs  or Organizations: Not on file  . Attends Banker Meetings: Not on file  . Marital Status: Not on file  Intimate Partner Violence:   . Fear of Current or Ex-Partner: Not on file  . Emotionally Abused: Not on file  . Physically Abused: Not on file  . Sexually Abused: Not on file    Family History  Problem Relation Age of Onset  . Lupus Mother   . Kidney failure Mother   . Heart attack Father   . Colon cancer Brother   . Pancreatic cancer Sister     ROS- All systems are reviewed and negative except as per the HPI above  Physical Exam: Vitals:   04/29/19 1431  BP: (!) 198/100  Pulse: 62  Weight: 45.2 kg  Height: 5\' 4"  (1.626 m)   Wt Readings from Last 3 Encounters:  04/29/19 45.2 kg  04/26/19 46.3 kg  03/04/19 45.8 kg    Labs: Lab Results  Component Value Date   NA 142 04/26/2019   K 3.9 04/26/2019   CL 105 04/26/2019   CO2 28 04/26/2019   GLUCOSE 123 (H) 04/26/2019   BUN 13 04/26/2019   CREATININE 0.61 04/26/2019   CALCIUM 9.2 04/26/2019   MG 2.0 03/04/2019   Lab Results  Component Value Date   INR 1.06 03/03/2015   Lab Results  Component Value Date   CHOL 197 03/04/2019   HDL 62 03/04/2019   LDLCALC 115 (H) 03/04/2019   TRIG 101 03/04/2019     GEN- The patient is well appearing, alert and oriented x 3 today.   Head- normocephalic, atraumatic Eyes-  Sclera clear, conjunctiva pink Ears- hearing intact Oropharynx- clear Neck- supple, no JVP Lymph- no cervical lymphadenopathy Lungs- Clear to ausculation bilaterally, normal work of breathing Heart- Regular rate and rhythm, no murmurs, rubs or gallops, PMI not laterally displaced GI- soft, NT, ND, + BS Extremities- no clubbing, cyanosis, or edema MS- no significant deformity or atrophy Skin- no rash or lesion Psych- euthymic mood, full affect Neuro- strength and sensation are intact  EKG-NSR with LVH at 62 bpm. Epic records reviewed   Assessment and Plan: 1.  Brief  episode new  onset afib  General education re afib Triggers discussed Is already on 50 mg atenolol, will give an additional 25 mg in the pm to give better 24 hpur coverage Echo ordered   2. CHA2DS2VASc score of 4 Hate to commit pt to anticoagulation with such a brief episode  of afib during a stressful time  Will plan for a 2 week ZIO patch to see afib burden She will continue baby asa for now   I will see back in 3 weeks  Geroge Baseman Khylen Riolo, [facility] [address] [place], [place] [address] [phone]

## 2019-05-04 ENCOUNTER — Ambulatory Visit (INDEPENDENT_AMBULATORY_CARE_PROVIDER_SITE_OTHER): Payer: Medicare Other | Admitting: Physician Assistant

## 2019-05-04 ENCOUNTER — Other Ambulatory Visit: Payer: Self-pay

## 2019-05-04 ENCOUNTER — Encounter: Payer: Self-pay | Admitting: Physician Assistant

## 2019-05-04 VITALS — BP 118/72 | HR 66 | Temp 97.5°F | Wt 104.0 lb

## 2019-05-04 DIAGNOSIS — R8290 Unspecified abnormal findings in urine: Secondary | ICD-10-CM | POA: Diagnosis not present

## 2019-05-04 DIAGNOSIS — I480 Paroxysmal atrial fibrillation: Secondary | ICD-10-CM | POA: Diagnosis not present

## 2019-05-04 DIAGNOSIS — E039 Hypothyroidism, unspecified: Secondary | ICD-10-CM

## 2019-05-04 DIAGNOSIS — S53105D Unspecified dislocation of left ulnohumeral joint, subsequent encounter: Secondary | ICD-10-CM

## 2019-05-04 DIAGNOSIS — R42 Dizziness and giddiness: Secondary | ICD-10-CM | POA: Diagnosis not present

## 2019-05-04 MED ORDER — ATENOLOL 50 MG PO TABS: ORAL_TABLET | ORAL | 1 refills | Status: DC

## 2019-05-06 ENCOUNTER — Other Ambulatory Visit: Payer: Self-pay | Admitting: Physician Assistant

## 2019-05-06 LAB — URINALYSIS, ROUTINE W REFLEX MICROSCOPIC
Bacteria, UA: NONE SEEN /HPF
Bilirubin Urine: NEGATIVE
Glucose, UA: NEGATIVE
Hgb urine dipstick: NEGATIVE
Hyaline Cast: NONE SEEN /LPF
Ketones, ur: NEGATIVE
Nitrite: NEGATIVE
Protein, ur: NEGATIVE
RBC / HPF: NONE SEEN /HPF (ref 0–2)
Specific Gravity, Urine: 1.005 (ref 1.001–1.03)
Squamous Epithelial / HPF: NONE SEEN /HPF (ref <=5)
WBC, UA: NONE SEEN /HPF (ref 0–5)
pH: 7 (ref 5.0–8.0)

## 2019-05-06 LAB — URINE CULTURE
MICRO NUMBER:: 10004423
SPECIMEN QUALITY:: ADEQUATE

## 2019-05-06 LAB — TSH: TSH: 4.03 mIU/L (ref 0.40–4.50)

## 2019-05-06 MED ORDER — AMOXICILLIN 500 MG PO TABS: 500.0000 mg | ORAL_TABLET | Freq: Three times a day (TID) | ORAL | 0 refills | Status: AC

## 2019-05-07 DIAGNOSIS — M25522 Pain in left elbow: Secondary | ICD-10-CM | POA: Diagnosis not present

## 2019-05-11 ENCOUNTER — Other Ambulatory Visit: Payer: Self-pay | Admitting: Internal Medicine

## 2019-05-11 DIAGNOSIS — I480 Paroxysmal atrial fibrillation: Secondary | ICD-10-CM

## 2019-05-13 DIAGNOSIS — M25522 Pain in left elbow: Secondary | ICD-10-CM | POA: Diagnosis not present

## 2019-05-19 DIAGNOSIS — M25522 Pain in left elbow: Secondary | ICD-10-CM | POA: Diagnosis not present

## 2019-05-20 ENCOUNTER — Encounter (HOSPITAL_COMMUNITY): Payer: Self-pay | Admitting: Nurse Practitioner

## 2019-05-20 ENCOUNTER — Ambulatory Visit (HOSPITAL_COMMUNITY)
Admission: RE | Admit: 2019-05-20 | Discharge: 2019-05-20 | Disposition: A | Discharge status: Home / Self Care | Payer: Medicare Other | Source: Ambulatory Visit | Attending: Nurse Practitioner | Admitting: Nurse Practitioner

## 2019-05-20 ENCOUNTER — Other Ambulatory Visit: Payer: Self-pay

## 2019-05-20 ENCOUNTER — Ambulatory Visit (HOSPITAL_BASED_OUTPATIENT_CLINIC_OR_DEPARTMENT_OTHER)
Admission: RE | Admit: 2019-05-20 | Discharge: 2019-05-20 | Disposition: A | Discharge status: Home / Self Care | Payer: Medicare Other | Source: Ambulatory Visit | Attending: Nurse Practitioner | Admitting: Nurse Practitioner

## 2019-05-20 VITALS — BP 110/80 | HR 64 | Ht 64.0 in | Wt 102.0 lb

## 2019-05-20 DIAGNOSIS — Z79899 Other long term (current) drug therapy: Secondary | ICD-10-CM | POA: Diagnosis not present

## 2019-05-20 DIAGNOSIS — Z87891 Personal history of nicotine dependence: Secondary | ICD-10-CM | POA: Insufficient documentation

## 2019-05-20 DIAGNOSIS — E079 Disorder of thyroid, unspecified: Secondary | ICD-10-CM | POA: Insufficient documentation

## 2019-05-20 DIAGNOSIS — M1990 Unspecified osteoarthritis, unspecified site: Secondary | ICD-10-CM | POA: Insufficient documentation

## 2019-05-20 DIAGNOSIS — N183 Chronic kidney disease, stage 3 unspecified: Secondary | ICD-10-CM | POA: Insufficient documentation

## 2019-05-20 DIAGNOSIS — I480 Paroxysmal atrial fibrillation: Secondary | ICD-10-CM

## 2019-05-20 DIAGNOSIS — I130 Hypertensive heart and chronic kidney disease with heart failure and stage 1 through stage 4 chronic kidney disease, or unspecified chronic kidney disease: Secondary | ICD-10-CM | POA: Insufficient documentation

## 2019-05-20 DIAGNOSIS — Z8249 Family history of ischemic heart disease and other diseases of the circulatory system: Secondary | ICD-10-CM | POA: Diagnosis not present

## 2019-05-20 DIAGNOSIS — I082 Rheumatic disorders of both aortic and tricuspid valves: Secondary | ICD-10-CM | POA: Diagnosis not present

## 2019-05-20 DIAGNOSIS — I5032 Chronic diastolic (congestive) heart failure: Secondary | ICD-10-CM | POA: Diagnosis not present

## 2019-05-20 NOTE — Progress Notes (Signed)
Primary Care Physician: Unk Pinto, MD Referring Physician: [facility] ER f/u   Erica Owen is a 86 y.o. female with a h/o carotid artery disease, chronic diastolic heart failure, history of gastric ulcer and in the afib clinic for f/u of a brief episode of afib noted in the ER while pt was being treated for fall with fx of left elbow. She left in SR. She was placed on ASA and sent here. Has never been told she has a h/o of afib. Never aware of irregular heart beat. Has a h/o unsteady gait but this is her first fall. She states that dizziness contributed to her fall. Lives with her daughter, with her today. She is in SR today.   F/u in afib clinic, 05/20/19. She had an echo prior to appointment. Results pending. 2 week Zio patch is in transient. I will discuss both with her daughter when results are in. She has not had any further falls. No awareness of heart racing.    Today, she denies symptoms of palpitations, chest pain, shortness of breath, orthopnea, PND, lower extremity edema, dizziness, presyncope, syncope, or neurologic sequela. The patient is tolerating medications without difficulties and is otherwise without complaint today.   Past Medical History:  Diagnosis Date  . Abnormal EKG 03/02/2015  . Acute blood loss anemia 03/02/2015  . Acute encephalopathy 03/02/2015  . Arthritis   . Carotid artery stenosis 03/06/2015  . CKD (chronic kidney disease), stage III 03/06/2015  . Demand ischemia ([place]) 03/05/2015  . Gastric ulcer   . Gastritis 03/06/2015  . Hypertension   . IDA (iron deficiency anemia)   . Thyroid disease    Past Surgical History:  Procedure Laterality Date  . ABDOMINAL HYSTERECTOMY    . ESOPHAGOGASTRODUODENOSCOPY (EGD) WITH PROPOFOL N/A 03/03/2015   Procedure: ESOPHAGOGASTRODUODENOSCOPY (EGD) WITH PROPOFOL;  Surgeon: Jerene Bears, MD;  Location: [facility];  Service: Endoscopy;  Laterality: N/A;    Current Outpatient Medications  Medication Sig Dispense Refill  .  acetaminophen (TYLENOL) 500 MG tablet Take 500 mg by mouth every 6 (six) hours as needed for mild pain.    . Ascorbic Acid (VITAMIN C PO) Take 500 mg by mouth daily.    Marland Kitchen atenolol (TENORMIN) 50 MG tablet Take 1 tablet Daily for BP 90 tablet 1  . IRON, FERROUS SULFATE, PO Take 65 mg by mouth daily.    Marland Kitchen levothyroxine (SYNTHROID) 25 MCG tablet Take 1 tablet (25 mcg total) by mouth daily before breakfast. 90 tablet 1  . pantoprazole (PROTONIX) 40 MG tablet Take 1 tablet for Indigestion & Heartburn 90 tablet 1  . VITAMIN D, CHOLECALCIFEROL, PO Take by mouth.     No current facility-administered medications for this encounter.    No Known Allergies  Social History   Socioeconomic History  . Marital status: Widowed    Spouse name: Not on file  . Number of children: 2  . Years of education: Not on file  . Highest education level: Not on file  Occupational History  . Occupation: retired  Tobacco Use  . Smoking status: Former Smoker    Types: Cigarettes    Quit date: 03/09/1965    Years since quitting: 54.2  . Smokeless tobacco: Never Used  Substance and Sexual Activity  . Alcohol use: No    Alcohol/week: 0.0 standard drinks  . Drug use: No  . Sexual activity: Never  Other Topics Concern  . Not on file  Social History Narrative  . Not on file  Social Determinants of Health   Financial Resource Strain:   . Difficulty of Paying Living Expenses: Not on file  Food Insecurity:   . Worried About Programme researcher, broadcasting/film/video in the Last Year: Not on file  . Ran Out of Food in the Last Year: Not on file  Transportation Needs:   . Lack of Transportation (Medical): Not on file  . Lack of Transportation (Non-Medical): Not on file  Physical Activity:   . Days of Exercise per Week: Not on file  . Minutes of Exercise per Session: Not on file  Stress:   . Feeling of Stress : Not on file  Social Connections:   . Frequency of Communication with Friends and Family: Not on file  . Frequency of  Social Gatherings with Friends and Family: Not on file  . Attends Religious Services: Not on file  . Active Member of Clubs or Organizations: Not on file  . Attends Banker Meetings: Not on file  . Marital Status: Not on file  Intimate Partner Violence:   . Fear of Current or Ex-Partner: Not on file  . Emotionally Abused: Not on file  . Physically Abused: Not on file  . Sexually Abused: Not on file    Family History  Problem Relation Age of Onset  . Lupus Mother   . Kidney failure Mother   . Heart attack Father   . Colon cancer Brother   . Pancreatic cancer Sister     ROS- All systems are reviewed and negative except as per the HPI above  Physical Exam: Vitals:   05/20/19 1509  BP: 110/80  Pulse: 64  Weight: 46.3 kg  Height: 5\' 4"  (1.626 m)   Wt Readings from Last 3 Encounters:  05/20/19 46.3 kg  05/04/19 47.2 kg  04/29/19 45.2 kg    Labs: Lab Results  Component Value Date   NA 142 04/26/2019   K 3.9 04/26/2019   CL 105 04/26/2019   CO2 28 04/26/2019   GLUCOSE 123 (H) 04/26/2019   BUN 13 04/26/2019   CREATININE 0.61 04/26/2019   CALCIUM 9.2 04/26/2019   MG 2.0 03/04/2019   Lab Results  Component Value Date   INR 1.06 03/03/2015   Lab Results  Component Value Date   CHOL 197 03/04/2019   HDL 62 03/04/2019   LDLCALC 115 (H) 03/04/2019   TRIG 101 03/04/2019     GEN- The patient is well appearing, alert and oriented x 3 today.   Head- normocephalic, atraumatic Eyes-  Sclera clear, conjunctiva pink Ears- hearing intact Oropharynx- clear Neck- supple, no JVP Lymph- no cervical lymphadenopathy Lungs- Clear to ausculation bilaterally, normal work of breathing Heart- Regular rate and rhythm, no murmurs, rubs or gallops, PMI not laterally displaced GI- soft, NT, ND, + BS Extremities- no clubbing, cyanosis, or edema MS- no significant deformity or atrophy Skin- no rash or lesion Psych- euthymic mood, full affect Neuro- strength and  sensation are intact  EKG-NSR  At 64 bpm Epic records reviewed   Assessment and Plan: 1.  Brief  episode new onset afib  General education re afib No awareness of afib  Echo and monitor results are pending Triggers discussed Continue atenolol   2. CHA2DS2VASc score of 4 Hate to commit pt to anticoagulation with such a brief episode  of afib during a stressful time of fall/injury Awaiting results of  Monitor to see if DOAC is indicated  She will continue baby asa for now   I will  discuss with her when results are back   Lupita Leash C. [facility] [address] [place], [place] [address] [phone]

## 2019-05-24 ENCOUNTER — Encounter: Payer: Self-pay | Admitting: Internal Medicine

## 2019-05-24 NOTE — Patient Instructions (Signed)

## 2019-05-24 NOTE — Progress Notes (Signed)
History of Present Illness:      This very nice 86 y.o.female presents for 3 month follow up with HTN, HLD, Pre-Diabetes and Vitamin D Deficiency.       Patient is treated for HTN (2016) & BP has been controlled at home. Today's BPis elevated at 160/100 and rechecked x 2. Patient had recent fall and Lt elbow fx & was noted to have  a brief episode of  pAfib and is being evaluated at the Afib clinic with a Zio patch.  She also has CKD3 felt consequent of her HTCVD. Patient has had no complaints of any cardiac type chest pain, palpitations, dyspnea / orthopnea / PND, dizziness, claudication, or dependent edema.      Hyperlipidemia is not controlled with diet. Last Lipids were not at goal & not treated aggressively for age:  Lab Results  Component Value Date   CHOL 197 03/04/2019   HDL 62 03/04/2019   LDLCALC 115 (H) 03/04/2019   TRIG 101 03/04/2019   CHOLHDL 3.2 03/04/2019        Also, the patient has history of  Abnormal glucose and has had no symptoms of reactive hypoglycemia, diabetic polys, paresthesias or visual blurring.  Last A1c was Normal & at goal:  Lab Results  Component Value Date   HGBA1C 5.3 08/23/2016       Patient was dx'd Hypothyroid in Nov 2020 & initiated on replacement therapy.       Further, the patient also has history of Vitamin D Deficiency and supplements vitamin D without any suspected side-effects. Last vitamin D was still low (goal 70-100):  Lab Results  Component Value Date   VD25OH 45 03/04/2019    Current Outpatient Medications on File Prior to Visit  Medication Sig  . acetaminophen (TYLENOL) 500 MG tablet Take 500 mg by mouth every 6 (six) hours as needed for mild pain.  . Ascorbic Acid (VITAMIN C PO) Take 500 mg by mouth daily.  Marland Kitchen atenolol (TENORMIN) 50 MG tablet Take 1 tablet Daily for BP  . IRON, FERROUS SULFATE, PO Take 65 mg by mouth daily.  Marland Kitchen levothyroxine (SYNTHROID) 25 MCG tablet Take 1 tablet (25 mcg total) by mouth daily before  breakfast.  . pantoprazole (PROTONIX) 40 MG tablet Take 1 tablet for Indigestion & Heartburn  . VITAMIN D, CHOLECALCIFEROL, PO Take by mouth.   No current facility-administered medications on file prior to visit.    No Known Allergies  PMHx:   Past Medical History:  Diagnosis Date  . Abnormal EKG 03/02/2015  . Acute blood loss anemia 03/02/2015  . Acute encephalopathy 03/02/2015  . Arthritis   . Carotid artery stenosis 03/06/2015  . CKD (chronic kidney disease), stage III 03/06/2015  . Demand ischemia ([facility]) 03/05/2015  . Gastric ulcer   . Gastritis 03/06/2015  . Hypertension   . IDA (iron deficiency anemia)   . Thyroid disease    Immunization History  Administered Date(s) Administered  . Influenza, High Dose Seasonal PF 04/01/2017, 03/21/2018, 01/28/2019   Past Surgical History:  Procedure Laterality Date  . ABDOMINAL HYSTERECTOMY    . ESOPHAGOGASTRODUODENOSCOPY (EGD) WITH PROPOFOL N/A 03/03/2015   Procedure: ESOPHAGOGASTRODUODENOSCOPY (EGD) WITH PROPOFOL;  Surgeon: Beverley Fiedler, MD;  Location: [facility];  Service: Endoscopy;  Laterality: N/A;    FHx:    Reviewed / unchanged  SHx:    Reviewed / unchanged   Systems Review:  Constitutional: Denies fever, chills, wt changes, headaches, insomnia, fatigue, night sweats,  change in appetite. Eyes: Denies redness, blurred vision, diplopia, discharge, itchy, watery eyes.  ENT: Denies discharge, congestion, post nasal drip, epistaxis, sore throat, earache, hearing loss, dental pain, tinnitus, vertigo, sinus pain, snoring.  CV: Denies chest pain, palpitations, irregular heartbeat, syncope, dyspnea, diaphoresis, orthopnea, PND, claudication or edema. Respiratory: denies cough, dyspnea, DOE, pleurisy, hoarseness, laryngitis, wheezing.  Gastrointestinal: Denies dysphagia, odynophagia, heartburn, reflux, water brash, abdominal pain or cramps, nausea, vomiting, bloating, diarrhea, constipation, hematemesis, melena, hematochezia  or  hemorrhoids. Genitourinary: Denies dysuria, frequency, urgency, nocturia, hesitancy, discharge, hematuria or flank pain. Musculoskeletal: Denies arthralgias, myalgias, stiffness, jt. swelling, pain, limping or strain/sprain.  Skin: Denies pruritus, rash, hives, warts, acne, eczema or change in skin lesion(s). Neuro: No weakness, tremor, incoordination, spasms, paresthesia or pain. Psychiatric: Denies confusion, memory loss or sensory loss. Endo: Denies change in weight, skin or hair change.  Heme/Lymph: No excessive bleeding, bruising or enlarged lymph nodes.  Physical Exam  BP (!) 160/100   Pulse 64   Temp (!) 97 F (36.1 C)   Resp 16   Ht 5\' 4"  (1.626 m)   Wt 101 lb 6.4 oz (46 kg)   BMI 17.41 kg/m   Appears  well nourished, well groomed  and in no distress.  Eyes: PERRLA, EOMs, conjunctiva no swelling or erythema. Sinuses: No frontal/maxillary tenderness ENT/Mouth: EAC's clear, TM's nl w/o erythema, bulging. Nares clear w/o erythema, swelling, exudates. Oropharynx clear without erythema or exudates. Oral hygiene is good. Tongue normal, non obstructing. Hearing intact.  Neck: Supple. Thyroid not palpable. Car 2+/2+ without bruits, nodes or JVD. Chest: Respirations nl with BS clear & equal w/o rales, rhonchi, wheezing or stridor.  Cor: Heart sounds normal w/ regular rate and rhythm without sig. murmurs, gallops, clicks or rubs. Peripheral pulses normal and equal  without edema.  Abdomen: Soft & bowel sounds normal. Non-tender w/o guarding, rebound, hernias, masses or organomegaly.  Lymphatics: Unremarkable.  Musculoskeletal: Full ROM all peripheral extremities, joint stability, 5/5 strength and normal gait.  Skin: Warm, dry without exposed rashes, lesions or ecchymosis apparent.  Neuro: Cranial nerves intact, reflexes equal bilaterally. Sensory-motor testing grossly intact. Tendon reflexes grossly intact.  Pysch: Alert & oriented x 3.  Insight and judgement nl & appropriate. No  ideations.  Assessment and Plan:  1. Essential hypertension  -  Daughter to monitor blood pressure at  2 x/day and call report in 1 week.  - Continue DASH diet.  Reminder to go to the ER if any CP,  SOB, nausea, dizziness, severe HA, changes vision/speech.  - CBC with Differential/Platelet - COMPLETE METABOLIC PANEL WITH GFR - Magnesium - TSH  2. Hyperlipidemia, mixed  - Continue diet, exercise & lifestyle modifications.   - TSH  3. Abnormal glucose  - Continue diet, exercise  - Lifestyle modifications.  - Monitor appropriate labs.  - Hemoglobin A1c - Insulin, random  4. Vitamin D deficiency  - Continue supplementation.  - VITAMIN D 25 Hydroxy  5. Hypothyroidism  - TSH  6. Stage 3 chronic kidney disease, unspecified  whether stage 3a or 3b CKD  - COMPLETE METABOLIC PANEL WITH GFR  7. Medication management  - CBC with Differential/Platelet - COMPLETE METABOLIC PANEL WITH GFR - Magnesium - TSH - Hemoglobin A1c - Insulin, random - VITAMIN D 25 Hydroxy         Discussed  regular exercise, BP monitoring, weight control to achieve/maintain BMI less than 25 and discussed med and SE's. Recommended labs to assess and monitor clinical status with further  disposition pending results of labs.  I discussed the assessment and treatment plan with the patient. The patient was provided an opportunity to ask questions and all were answered. The patient agreed with the plan and demonstrated an understanding of the instructions.  I provided over 30 minutes of exam, counseling, chart review and  complex critical decision making.  Kirtland Bouchard, MD

## 2019-05-25 ENCOUNTER — Ambulatory Visit (INDEPENDENT_AMBULATORY_CARE_PROVIDER_SITE_OTHER): Payer: Medicare Other | Admitting: Internal Medicine

## 2019-05-25 ENCOUNTER — Other Ambulatory Visit: Payer: Self-pay

## 2019-05-25 VITALS — BP 160/100 | HR 64 | Temp 97.0°F | Resp 16 | Ht 64.0 in | Wt 101.4 lb

## 2019-05-25 DIAGNOSIS — E782 Mixed hyperlipidemia: Secondary | ICD-10-CM

## 2019-05-25 DIAGNOSIS — E039 Hypothyroidism, unspecified: Secondary | ICD-10-CM

## 2019-05-25 DIAGNOSIS — R7309 Other abnormal glucose: Secondary | ICD-10-CM

## 2019-05-25 DIAGNOSIS — Z79899 Other long term (current) drug therapy: Secondary | ICD-10-CM

## 2019-05-25 DIAGNOSIS — E559 Vitamin D deficiency, unspecified: Secondary | ICD-10-CM

## 2019-05-25 DIAGNOSIS — N183 Chronic kidney disease, stage 3 unspecified: Secondary | ICD-10-CM

## 2019-05-25 DIAGNOSIS — I10 Essential (primary) hypertension: Secondary | ICD-10-CM

## 2019-05-25 NOTE — Addendum Note (Signed)
Encounter addended by: Shona Simpson, RN on: 05/25/2019 2:48 PM  Actions taken: Imaging Exam ended

## 2019-05-26 LAB — COMPLETE METABOLIC PANEL WITH GFR
AG Ratio: 1.3 (calc) (ref 1.0–2.5)
ALT: 11 U/L (ref 6–29)
AST: 14 U/L (ref 10–35)
Albumin: 4 g/dL (ref 3.6–5.1)
Alkaline phosphatase (APISO): 126 U/L (ref 37–153)
BUN: 13 mg/dL (ref 7–25)
CO2: 32 mmol/L (ref 20–32)
Calcium: 9.7 mg/dL (ref 8.6–10.4)
Chloride: 102 mmol/L (ref 98–110)
Creat: 0.79 mg/dL (ref 0.60–0.88)
GFR, Est African American: 79 mL/min/{1.73_m2} (ref >=60)
GFR, Est Non African American: 68 mL/min/{1.73_m2} (ref >=60)
Globulin: 3.1 g/dL (calc) (ref 1.9–3.7)
Glucose, Bld: 101 mg/dL — ABNORMAL HIGH (ref 65–99)
Potassium: 4.2 mmol/L (ref 3.5–5.3)
Sodium: 139 mmol/L (ref 135–146)
Total Bilirubin: 0.7 mg/dL (ref 0.2–1.2)
Total Protein: 7.1 g/dL (ref 6.1–8.1)

## 2019-05-26 LAB — CBC WITH DIFFERENTIAL/PLATELET
Absolute Monocytes: 762 cells/uL (ref 200–950)
Basophils Absolute: 27 cells/uL (ref 0–200)
Basophils Relative: 0.4 %
Eosinophils Absolute: 129 cells/uL (ref 15–500)
Eosinophils Relative: 1.9 %
HCT: 37.7 % (ref 35.0–45.0)
Hemoglobin: 12.4 g/dL (ref 11.7–15.5)
Lymphs Abs: 1360 cells/uL (ref 850–3900)
MCH: 29.7 pg (ref 27.0–33.0)
MCHC: 32.9 g/dL (ref 32.0–36.0)
MCV: 90.2 fL (ref 80.0–100.0)
MPV: 11.2 fL (ref 7.5–12.5)
Monocytes Relative: 11.2 %
Neutro Abs: 4522 cells/uL (ref 1500–7800)
Neutrophils Relative %: 66.5 %
Platelets: 209 10*3/uL (ref 140–400)
RBC: 4.18 10*6/uL (ref 3.80–5.10)
RDW: 13 % (ref 11.0–15.0)
Total Lymphocyte: 20 %
WBC: 6.8 10*3/uL (ref 3.8–10.8)

## 2019-05-26 LAB — INSULIN, RANDOM: Insulin: 14.6 u[IU]/mL

## 2019-05-26 LAB — HEMOGLOBIN A1C
Hgb A1c MFr Bld: 5.4 % of total Hgb (ref <5.7)
Mean Plasma Glucose: 108 (calc)
eAG (mmol/L): 6 (calc)

## 2019-05-26 LAB — TSH: TSH: 3.91 mIU/L (ref 0.40–4.50)

## 2019-05-26 LAB — VITAMIN D 25 HYDROXY (VIT D DEFICIENCY, FRACTURES): Vit D, 25-Hydroxy: 35 ng/mL (ref 30–100)

## 2019-05-26 LAB — MAGNESIUM: Magnesium: 2.2 mg/dL (ref 1.5–2.5)

## 2019-06-01 DIAGNOSIS — M25522 Pain in left elbow: Secondary | ICD-10-CM | POA: Diagnosis not present

## 2019-06-09 DIAGNOSIS — M25522 Pain in left elbow: Secondary | ICD-10-CM | POA: Diagnosis not present

## 2019-08-31 ENCOUNTER — Encounter: Payer: Medicare Other | Admitting: Physician Assistant

## 2019-08-31 NOTE — Progress Notes (Deleted)
Assessment and Plan:  Paroxysmal atrial fibrillation ([place]) Continue follow up with cardiology  NSR today Has ZIO patch on now to find out disease burden We would prefer if she continues on bASA due to history of GI bleeds Check TSH and recheck urine for infection  -     atenolol (TENORMIN) 50 MG tablet; Take 1 tablet in the am and 1/2 tablet at bedtime -     For home use only DME 4 wheeled rolling walker with seat ([id number])  Dizziness -     For home use only DME 4 wheeled rolling walker with seat ([id number]) Improved but still with unsteady gait will send in walker  Dislocation of left elbow, subsequent encounter Continue follow up ortho Good ROM and strength in hand today  Hypothyroidism, unspecified type -     TSH Hypothyroidism-check TSH level, continue medications the same, reminded to take on an empty stomach 30-62mins before food.   Abnormal urinalysis -     Urinalysis, Routine w reflex microscopic -     Culture, Urine - likely contaminate but will recheck today   HPI 86 y.o.female with history of has History of gastric ulcer; Absolute anemia; Demand ischemia ([place]); Elevated TSH; Carotid artery stenosis; CKD (chronic kidney disease), stage III; Gastritis; Chronic diastolic heart failure ([place]); Mild malnutrition ([place]); and Paroxysmal atrial fibrillation ([facility]) on their problem list. presents for follow up from the ER. Patient went to the ER on 04/26/19, for:   S/p fall with elbow dislocation and Afib with RVR.  Her elbow was reduced in the ER with a good post reduction film. She called our office with bruising and pain left hand/fingers and saw ortho yesterday. Now in hard splint, and doing ROM exercises.  She has follow up with the Afib clinic pm 12/30, she is not on anticoagulation other than bASA due to age, has ZIO patch to see afib burden and make more informed desicision. She is on metoprolol 50 mg and 25 mg at night.      She was started on 25 mcg synthroid last visit.  Will recheck today, make sure she is not in hyperthyroid range.  Lab Results  Component Value Date   TSH 3.91 05/25/2019   She had a urine culture in the hospital that showed 30,000 colonies, she is not having any symptoms, will recheck urine, likely contaminate.   DG Elbow 2 Views Left  Result Date: 04/26/2019 CLINICAL DATA:  Post reduction film of a dislocated elbow. EXAM: LEFT ELBOW - 2 VIEW COMPARISON:  April 26, 2019 FINDINGS: The previously noted dislocation of the left elbow has been reduced in good position. No definite fracture is noted. IMPRESSION: The previously noted dislocation of the left elbow has been reduced in good position. Electronically Signed   By: Abelardo Diesel M.D.   On: 04/26/2019 12:24   DG Elbow Complete Left  Result Date: 04/26/2019 CLINICAL DATA:  Status post fall today with deformity of left elbow. EXAM: LEFT ELBOW - COMPLETE 3+ VIEW COMPARISON:  None. FINDINGS: There is anterior dislocation of the distal humerus relative to the proximal ulna and radius. No acute fracture is noted. IMPRESSION: Left elbow dislocation is described. Electronically Signed   By: Abelardo Diesel M.D.   On: 04/26/2019 10:39   CT Head Wo Contrast  Result Date: 04/26/2019 CLINICAL DATA:  86 year old female with dizziness and recent fall today EXAM: CT HEAD WITHOUT CONTRAST TECHNIQUE: Contiguous axial images were obtained from the base of the skull through the vertex  without intravenous contrast. COMPARISON:  Prior head CT 03/02/2015 FINDINGS: Brain: No evidence of acute infarction, hemorrhage, hydrocephalus, extra-axial collection or mass lesion/mass effect. Mild cerebral cortical volume loss. Patchy areas of periventricular, subcortical and deep white matter hypoattenuation are nonspecific but most consistent with chronic microvascular ischemic white matter disease. Vascular: No hyperdense vessel or unexpected calcification. Skull: Normal. Negative for fracture or focal lesion.  Sinuses/Orbits: No acute finding. Other: None. IMPRESSION: 1. No acute intracranial abnormality. 2. Moderate chronic microvascular ischemic white matter changes. Electronically Signed   By: Malachy Moan M.D.   On: 04/26/2019 13:54     Past Medical History:  Diagnosis Date  . Abnormal EKG 03/02/2015  . Acute blood loss anemia 03/02/2015  . Acute encephalopathy 03/02/2015  . Arthritis   . Carotid artery stenosis 03/06/2015  . CKD (chronic kidney disease), stage III 03/06/2015  . Demand ischemia ([facility]) 03/05/2015  . Gastric ulcer   . Gastritis 03/06/2015  . Hypertension   . IDA (iron deficiency anemia)   . Thyroid disease      No Known Allergies    Current Outpatient Medications on File Prior to Visit  Medication Sig Dispense Refill  . acetaminophen (TYLENOL) 500 MG tablet Take 500 mg by mouth every 6 (six) hours as needed for mild pain.    . Ascorbic Acid (VITAMIN C PO) Take 500 mg by mouth daily.    Marland Kitchen aspirin EC 81 MG tablet Take 81 mg by mouth daily.    Marland Kitchen atenolol (TENORMIN) 50 MG tablet Take 1 tablet Daily for BP 90 tablet 1  . IRON, FERROUS SULFATE, PO Take 65 mg by mouth daily.    Marland Kitchen levothyroxine (SYNTHROID) 25 MCG tablet Take 1 tablet (25 mcg total) by mouth daily before breakfast. 90 tablet 1  . pantoprazole (PROTONIX) 40 MG tablet Take 1 tablet for Indigestion & Heartburn 90 tablet 1  . vitamin B-12 (CYANOCOBALAMIN) 100 MCG tablet Take 100 mcg by mouth daily.    Marland Kitchen VITAMIN D, CHOLECALCIFEROL, PO Take by mouth.     No current facility-administered medications on file prior to visit.    ROS: all negative except above.   Physical Exam: There were no vitals filed for this visit. There were no vitals taken for this visit. General Appearance: Well nourished, in no apparent distress. Eyes: PERRLA, EOMs, conjunctiva no swelling or erythema Sinuses: No Frontal/maxillary tenderness ENT/Mouth: Ext aud canals clear, TMs without erythema, bulging. No erythema, swelling, or exudate  on post pharynx.  Tonsils not swollen or erythematous. Hearing normal.  Neck: Supple, thyroid normal.  Respiratory: Respiratory effort normal, BS equal bilaterally without rales, rhonchi, wheezing or stridor.  Cardio: RRR with no MRGs. Brisk peripheral pulses without edema.  Abdomen: Soft, + BS.  Non tender, no guarding, rebound, hernias, masses. Lymphatics: Non tender without lymphadenopathy.  Musculoskeletal: Full ROM except left elbow, still in hard splint, left hand with healing ecchymosis, good sensation and good grip, 4/5 strength, unsteady gait Skin: Warm, dry without rashes, lesions, ecchymosis.  Neuro: Cranial nerves intact. Normal muscle tone, no cerebellar symptoms.  Psych: Awake and oriented X 3, normal affect, Insight and Judgment appropriate.     Elder Negus, NP 11:22 PM [facility]

## 2019-09-01 ENCOUNTER — Ambulatory Visit: Payer: Medicare Other | Admitting: Adult Health Nurse Practitioner

## 2019-09-01 DIAGNOSIS — E559 Vitamin D deficiency, unspecified: Secondary | ICD-10-CM

## 2019-09-01 DIAGNOSIS — I10 Essential (primary) hypertension: Secondary | ICD-10-CM

## 2019-09-01 DIAGNOSIS — E039 Hypothyroidism, unspecified: Secondary | ICD-10-CM

## 2019-09-01 DIAGNOSIS — E782 Mixed hyperlipidemia: Secondary | ICD-10-CM

## 2019-09-01 DIAGNOSIS — I480 Paroxysmal atrial fibrillation: Secondary | ICD-10-CM

## 2019-09-01 DIAGNOSIS — Z79899 Other long term (current) drug therapy: Secondary | ICD-10-CM

## 2019-09-01 DIAGNOSIS — R7309 Other abnormal glucose: Secondary | ICD-10-CM

## 2019-09-06 ENCOUNTER — Other Ambulatory Visit: Payer: Self-pay | Admitting: Physician Assistant

## 2019-09-15 ENCOUNTER — Other Ambulatory Visit: Payer: Self-pay

## 2019-09-15 ENCOUNTER — Encounter: Payer: Self-pay | Admitting: Adult Health Nurse Practitioner

## 2019-09-15 ENCOUNTER — Ambulatory Visit (INDEPENDENT_AMBULATORY_CARE_PROVIDER_SITE_OTHER): Payer: Medicare Other | Admitting: Adult Health Nurse Practitioner

## 2019-09-15 VITALS — BP 130/72 | HR 66 | Temp 97.6°F | Wt 103.0 lb

## 2019-09-15 DIAGNOSIS — I10 Essential (primary) hypertension: Secondary | ICD-10-CM | POA: Diagnosis not present

## 2019-09-15 DIAGNOSIS — I480 Paroxysmal atrial fibrillation: Secondary | ICD-10-CM

## 2019-09-15 DIAGNOSIS — I5032 Chronic diastolic (congestive) heart failure: Secondary | ICD-10-CM

## 2019-09-15 DIAGNOSIS — E039 Hypothyroidism, unspecified: Secondary | ICD-10-CM | POA: Diagnosis not present

## 2019-09-15 DIAGNOSIS — Z8719 Personal history of other diseases of the digestive system: Secondary | ICD-10-CM

## 2019-09-15 DIAGNOSIS — Z79899 Other long term (current) drug therapy: Secondary | ICD-10-CM

## 2019-09-15 DIAGNOSIS — E782 Mixed hyperlipidemia: Secondary | ICD-10-CM

## 2019-09-15 DIAGNOSIS — N183 Chronic kidney disease, stage 3 unspecified: Secondary | ICD-10-CM

## 2019-09-15 DIAGNOSIS — Z8711 Personal history of peptic ulcer disease: Secondary | ICD-10-CM

## 2019-09-15 DIAGNOSIS — Z681 Body mass index (BMI) 19 or less, adult: Secondary | ICD-10-CM

## 2019-09-15 DIAGNOSIS — E559 Vitamin D deficiency, unspecified: Secondary | ICD-10-CM | POA: Diagnosis not present

## 2019-09-15 DIAGNOSIS — E441 Mild protein-calorie malnutrition: Secondary | ICD-10-CM

## 2019-09-15 DIAGNOSIS — R7309 Other abnormal glucose: Secondary | ICD-10-CM

## 2019-09-15 NOTE — Progress Notes (Signed)
3 MONTH FOLLOW UP   Assessment and Plan: Kalea was seen today for follow-up.  Diagnoses and all orders for this visit:  Paroxysmal atrial fibrillation (HCC) Follows with cardiology Continues on bASA Continue atenolol 50mg  daily Asymptomatic Continue to monitor Ruled out cardiomyopathy w/ previous echo   Essential hypertension Continue current medications: Monitor blood pressure at home; call if consistently over 130/80 Continue DASH diet.   Reminder to go to the ER if any CP, SOB, nausea, dizziness, severe HA, changes vision/speech, left arm numbness and tingling and jaw pain. -     CBC with Differential/Platelet -     COMPLETE METABOLIC PANEL WITH GFR  Hyperlipidemia, mixed No medications Discussed dietary and exercise modifications Low fat diet   Hypothyroidism, unspecified type Taking levothyroxine 25 mcg daily Reminder to take on an empty stomach 30-52mins before first meal of the day. No antacid medications for 4 hours. -     TSH  Vitamin D deficiency Continue supplementation Taking Vitamin D daily, unsure of dose -     VITAMIN D 25 Hydroxy (Vit-D Deficiency, Fractures)  Abnormal glucose Discussed dietary and exercise modifications  Stage 3 chronic kidney disease, unspecified whether stage 3a or 3b CKD Increase fluids  Avoid NSAIDS Blood pressure control Monitor sugars  Will continue to monitor  History of gastric ulcer Avoid NSAIDS Continue to monitor  Medication management -Continued.  Mild malnutrition (HCC) Discussed dietary and exercise modifications  BMI less than 19, adult,  BMI 17.68 Discussed dietary and exercise modifications    HPI 86 y.o.female with history of has History of gastric ulcer; Absolute anemia; Demand ischemia (HCC); Elevated TSH; Carotid artery stenosis; CKD (chronic kidney disease), stage III; Gastritis; Chronic diastolic heart failure (HCC); Mild malnutrition (HCC); and Paroxysmal atrial fibrillation (HCC) on their  problem list. presents for 3 month follow up.  She reports overall she is doing well.  She has no health or medication concerns today.  She has been working on diet and exercise for abnormal glucose without complications, she is on bASA, she is not on ACE/ARB, and denies nausea, paresthesia of the feet, polydipsia, polyuria, visual disturbances, vomiting and weight loss. Last A1C was:  Lab Results  Component Value Date   HGBA1C 5.4 05/25/2019      She  is not  on cholesterol medication:Her cholesterol is not at goal. The cholesterol last visit was:   Lab Results  Component Value Date   CHOL 197 03/04/2019   HDL 62 03/04/2019   LDLCALC 115 (H) 03/04/2019   TRIG 101 03/04/2019   CHOLHDL 3.2 03/04/2019     She follow s with cardiology.  She had a fall with elbow dislocation and Afib with RVRLast  F/u in afib clinic, 05/20/19. She had an echo prior to appointment.  Results: Left ventricular ejection fraction, by visual estimation, is 55 to 60%. The left ventricle has normal function. There is moderately increased left ventricular hypertrophy. 2. Moderate asymmetric basal septal hypertrophy. No evidence of LVOT gradient or SAM to suggest hypertrophic cardiomyopathy. Similar appearance to prior study 03/13/2015.    Zio patch did not show any Afib.  She is not on anticoagulation other than bASA due to age and GI bleed risk.BP: 130/72 She take atenolol 50mg  daily.  She takes levothyroxine 25 mcg daily in am.   Her last labs were in normal range and no change made to her regiment. Lab Results  Component Value Date   TSH 3.65 09/15/2019     Past Medical History:  Diagnosis Date  . Abnormal EKG 03/02/2015  . Acute blood loss anemia 03/02/2015  . Acute encephalopathy 03/02/2015  . Arthritis   . Carotid artery stenosis 03/06/2015  . CKD (chronic kidney disease), stage III 03/06/2015  . Demand ischemia ([place]) 03/05/2015  . Gastric ulcer   . Gastritis 03/06/2015  . Hypertension   . IDA (iron  deficiency anemia)   . Thyroid disease      No Known Allergies    Current Outpatient Medications on File Prior to Visit  Medication Sig Dispense Refill  . acetaminophen (TYLENOL) 500 MG tablet Take 500 mg by mouth every 6 (six) hours as needed for mild pain.    . Ascorbic Acid (VITAMIN C PO) Take 500 mg by mouth daily.    Marland Kitchen aspirin EC 81 MG tablet Take 81 mg by mouth daily.    Marland Kitchen atenolol (TENORMIN) 50 MG tablet Take 1 tablet Daily for BP 90 tablet 1  . IRON, FERROUS SULFATE, PO Take 65 mg by mouth daily.    Marland Kitchen levothyroxine (SYNTHROID) 25 MCG tablet Take 1 tablet daily on an empty stomach with only water for 30 minutes & no Antacid meds, Calcium or Magnesium for 4 hours & avoid Biotin 90 tablet 0  . pantoprazole (PROTONIX) 40 MG tablet Take 1 tablet for Indigestion & Heartburn 90 tablet 1  . vitamin B-12 (CYANOCOBALAMIN) 100 MCG tablet Take 100 mcg by mouth daily.    Marland Kitchen VITAMIN D, CHOLECALCIFEROL, PO Take by mouth.     No current facility-administered medications on file prior to visit.   SARS - [place] -Complete: 06/12/2019   ROS: all negative except above.   Physical Exam: Filed Weights   09/15/19 1055  Weight: 103 lb (46.7 kg)   BP 130/72   Pulse 66   Temp 97.6 F (36.4 C)   Wt 103 lb (46.7 kg)   SpO2 95%   BMI 17.68 kg/m  General Appearance: Well nourished, in no apparent distress. Eyes: PERRLA, EOMs, conjunctiva no swelling or erythema Sinuses: No Frontal/maxillary tenderness ENT/Mouth: Ext aud canals clear, TMs without erythema, bulging. No erythema, swelling, or exudate on post pharynx.  Tonsils not swollen or erythematous. Hearing normal.  Neck: Supple, thyroid normal.  Respiratory: Respiratory effort normal, BS equal bilaterally without rales, rhonchi, wheezing or stridor.  Cardio: RRR with no MRGs. Brisk peripheral pulses without edema.  Abdomen: Soft, + BS.  Non tender, no guarding, rebound, hernias, masses. Lymphatics: Non tender without lymphadenopathy.   Musculoskeletal: Full ROM except left elbow, still in hard splint, left hand with healing ecchymosis, good sensation and good grip, 4/5 strength, unsteady gait Skin: Warm, dry without rashes, lesions, ecchymosis.  Neuro: Cranial nerves intact. Normal muscle tone, no cerebellar symptoms.  Psych: Awake and oriented X 3, normal affect, Insight and Judgment appropriate.     Garnet Sierras, NP 11:50 AM [facility]

## 2019-09-15 NOTE — Patient Instructions (Signed)
Declined, use MyChart

## 2019-09-16 LAB — COMPLETE METABOLIC PANEL WITH GFR
AG Ratio: 1.3 (calc) (ref 1.0–2.5)
ALT: 12 U/L (ref 6–29)
AST: 14 U/L (ref 10–35)
Albumin: 3.9 g/dL (ref 3.6–5.1)
Alkaline phosphatase (APISO): 125 U/L (ref 37–153)
BUN: 15 mg/dL (ref 7–25)
CO2: 32 mmol/L (ref 20–32)
Calcium: 10 mg/dL (ref 8.6–10.4)
Chloride: 104 mmol/L (ref 98–110)
Creat: 0.74 mg/dL (ref 0.60–0.88)
GFR, Est African American: 85 mL/min/{1.73_m2} (ref >=60)
GFR, Est Non African American: 73 mL/min/{1.73_m2} (ref >=60)
Globulin: 2.9 g/dL (calc) (ref 1.9–3.7)
Glucose, Bld: 93 mg/dL (ref 65–99)
Potassium: 4.3 mmol/L (ref 3.5–5.3)
Sodium: 143 mmol/L (ref 135–146)
Total Bilirubin: 1 mg/dL (ref 0.2–1.2)
Total Protein: 6.8 g/dL (ref 6.1–8.1)

## 2019-09-16 LAB — CBC WITH DIFFERENTIAL/PLATELET
Absolute Monocytes: 496 cells/uL (ref 200–950)
Basophils Absolute: 30 cells/uL (ref 0–200)
Basophils Relative: 0.5 %
Eosinophils Absolute: 59 cells/uL (ref 15–500)
Eosinophils Relative: 1 %
HCT: 37 % (ref 35.0–45.0)
Hemoglobin: 12 g/dL (ref 11.7–15.5)
Lymphs Abs: 1044 cells/uL (ref 850–3900)
MCH: 28.9 pg (ref 27.0–33.0)
MCHC: 32.4 g/dL (ref 32.0–36.0)
MCV: 89.2 fL (ref 80.0–100.0)
MPV: 11.3 fL (ref 7.5–12.5)
Monocytes Relative: 8.4 %
Neutro Abs: 4272 cells/uL (ref 1500–7800)
Neutrophils Relative %: 72.4 %
Platelets: 182 10*3/uL (ref 140–400)
RBC: 4.15 10*6/uL (ref 3.80–5.10)
RDW: 13 % (ref 11.0–15.0)
Total Lymphocyte: 17.7 %
WBC: 5.9 10*3/uL (ref 3.8–10.8)

## 2019-09-16 LAB — VITAMIN D 25 HYDROXY (VIT D DEFICIENCY, FRACTURES): Vit D, 25-Hydroxy: 47 ng/mL (ref 30–100)

## 2019-09-16 LAB — TSH: TSH: 3.65 mIU/L (ref 0.40–4.50)

## 2019-11-03 ENCOUNTER — Encounter: Payer: Medicare Other | Admitting: Physician Assistant

## 2019-11-13 ENCOUNTER — Other Ambulatory Visit: Payer: Self-pay | Admitting: Internal Medicine

## 2019-11-13 DIAGNOSIS — I480 Paroxysmal atrial fibrillation: Secondary | ICD-10-CM

## 2019-12-07 ENCOUNTER — Encounter: Payer: Medicare Other | Admitting: Physician Assistant

## 2019-12-15 ENCOUNTER — Other Ambulatory Visit: Payer: Self-pay | Admitting: Internal Medicine

## 2019-12-17 ENCOUNTER — Ambulatory Visit (INDEPENDENT_AMBULATORY_CARE_PROVIDER_SITE_OTHER): Payer: Medicare Other | Admitting: Physician Assistant

## 2019-12-17 ENCOUNTER — Encounter: Payer: Self-pay | Admitting: Physician Assistant

## 2019-12-17 ENCOUNTER — Other Ambulatory Visit: Payer: Self-pay

## 2019-12-17 VITALS — BP 126/82 | HR 66 | Temp 97.5°F | Ht 62.5 in | Wt 100.0 lb

## 2019-12-17 DIAGNOSIS — I10 Essential (primary) hypertension: Secondary | ICD-10-CM

## 2019-12-17 DIAGNOSIS — E441 Mild protein-calorie malnutrition: Secondary | ICD-10-CM

## 2019-12-17 DIAGNOSIS — K2970 Gastritis, unspecified, without bleeding: Secondary | ICD-10-CM

## 2019-12-17 DIAGNOSIS — D649 Anemia, unspecified: Secondary | ICD-10-CM | POA: Diagnosis not present

## 2019-12-17 DIAGNOSIS — I480 Paroxysmal atrial fibrillation: Secondary | ICD-10-CM

## 2019-12-17 DIAGNOSIS — Z8719 Personal history of other diseases of the digestive system: Secondary | ICD-10-CM

## 2019-12-17 DIAGNOSIS — Z0000 Encounter for general adult medical examination without abnormal findings: Secondary | ICD-10-CM

## 2019-12-17 DIAGNOSIS — E782 Mixed hyperlipidemia: Secondary | ICD-10-CM | POA: Diagnosis not present

## 2019-12-17 DIAGNOSIS — E559 Vitamin D deficiency, unspecified: Secondary | ICD-10-CM

## 2019-12-17 DIAGNOSIS — Z8711 Personal history of peptic ulcer disease: Secondary | ICD-10-CM

## 2019-12-17 DIAGNOSIS — N183 Chronic kidney disease, stage 3 unspecified: Secondary | ICD-10-CM

## 2019-12-17 DIAGNOSIS — R7989 Other specified abnormal findings of blood chemistry: Secondary | ICD-10-CM

## 2019-12-17 DIAGNOSIS — Z79899 Other long term (current) drug therapy: Secondary | ICD-10-CM

## 2019-12-17 DIAGNOSIS — I6523 Occlusion and stenosis of bilateral carotid arteries: Secondary | ICD-10-CM

## 2019-12-17 DIAGNOSIS — Z0001 Encounter for general adult medical examination with abnormal findings: Secondary | ICD-10-CM

## 2019-12-17 DIAGNOSIS — E538 Deficiency of other specified B group vitamins: Secondary | ICD-10-CM

## 2019-12-17 LAB — MAGNESIUM: Magnesium: 2.2 mg/dL (ref 1.5–2.5)

## 2019-12-17 LAB — CBC WITH DIFFERENTIAL/PLATELET
Absolute Monocytes: 598 cells/uL (ref 200–950)
Basophils Absolute: 7 cells/uL (ref 0–200)
Basophils Relative: 0.1 %
Eosinophils Absolute: 41 cells/uL (ref 15–500)
Eosinophils Relative: 0.6 %
HCT: 38.2 % (ref 35.0–45.0)
Hemoglobin: 12.5 g/dL (ref 11.7–15.5)
Lymphs Abs: 1149 cells/uL (ref 850–3900)
MCH: 29.1 pg (ref 27.0–33.0)
MCHC: 32.7 g/dL (ref 32.0–36.0)
MCV: 89 fL (ref 80.0–100.0)
MPV: 11.2 fL (ref 7.5–12.5)
Monocytes Relative: 8.8 %
Neutro Abs: 5005 cells/uL (ref 1500–7800)
Neutrophils Relative %: 73.6 %
Platelets: 186 10*3/uL (ref 140–400)
RBC: 4.29 10*6/uL (ref 3.80–5.10)
RDW: 13.7 % (ref 11.0–15.0)
Total Lymphocyte: 16.9 %
WBC: 6.8 10*3/uL (ref 3.8–10.8)

## 2019-12-17 LAB — FERRITIN: Ferritin: 444 ng/mL — ABNORMAL HIGH (ref 16–288)

## 2019-12-17 LAB — COMPLETE METABOLIC PANEL WITH GFR
AG Ratio: 1.4 (calc) (ref 1.0–2.5)
ALT: 10 U/L (ref 6–29)
AST: 12 U/L (ref 10–35)
Albumin: 4 g/dL (ref 3.6–5.1)
Alkaline phosphatase (APISO): 125 U/L (ref 37–153)
BUN: 14 mg/dL (ref 7–25)
CO2: 29 mmol/L (ref 20–32)
Calcium: 10.1 mg/dL (ref 8.6–10.4)
Chloride: 104 mmol/L (ref 98–110)
Creat: 0.7 mg/dL (ref 0.60–0.88)
GFR, Est African American: 91 mL/min/{1.73_m2} (ref >=60)
GFR, Est Non African American: 78 mL/min/{1.73_m2} (ref >=60)
Globulin: 2.8 g/dL (calc) (ref 1.9–3.7)
Glucose, Bld: 106 mg/dL — ABNORMAL HIGH (ref 65–99)
Potassium: 3.8 mmol/L (ref 3.5–5.3)
Sodium: 143 mmol/L (ref 135–146)
Total Bilirubin: 0.8 mg/dL (ref 0.2–1.2)
Total Protein: 6.8 g/dL (ref 6.1–8.1)

## 2019-12-17 LAB — VITAMIN B12: Vitamin B-12: >2000 pg/mL — ABNORMAL HIGH (ref 200–1100)

## 2019-12-17 LAB — LIPID PANEL
Cholesterol: 182 mg/dL (ref <200)
HDL: 51 mg/dL (ref >=50)
LDL Cholesterol (Calc): 110 mg/dL (calc) — ABNORMAL HIGH
Non-HDL Cholesterol (Calc): 131 mg/dL (calc) — ABNORMAL HIGH (ref <130)
Total CHOL/HDL Ratio: 3.6 (calc) (ref <5.0)
Triglycerides: 100 mg/dL (ref <150)

## 2019-12-17 LAB — IRON, TOTAL/TOTAL IRON BINDING CAP
%SAT: 17 % (calc) (ref 16–45)
Iron: 43 ug/dL — ABNORMAL LOW (ref 45–160)
TIBC: 254 mcg/dL (calc) (ref 250–450)

## 2019-12-17 LAB — TSH: TSH: 3.82 mIU/L (ref 0.40–4.50)

## 2019-12-17 LAB — VITAMIN D 25 HYDROXY (VIT D DEFICIENCY, FRACTURES): Vit D, 25-Hydroxy: 69 ng/mL (ref 30–100)

## 2019-12-17 NOTE — Patient Instructions (Signed)
Add back in ensure/boost  WEIGHT GAIN  Try to make sure you are eating plenty of high calorie dense foods like: Avocado Nuts Peanut butter Oatmeal Dates Cottage cheese [place] yogurt Protein powder  WOMEN AND HEART ATTACKS  Being a woman you may not have the typical symptoms of a heart attack.  You may not have any pain OR you may have atypical pain such as jaw pain, upper back pain, arm pain, "my bra feels to tight" and you will often have symptoms with it like below.  Symptoms for a heart attack will likely occur when you exert your self or exercise and include: Shortness of breath Sweating Nausea Dizziness Fast or irregular heart beats Fatigue   It makes me feel better if my patients get their heart rate up with exercise once or twice a week and pay close attention to your body. If there is ANY change in your exercise capacity or if you have symptoms above, please STOP and call 911 or call to come to the office.   Here is some information to help you keep your heart healthy: Move it! - Aim for 30 mins of activity every day. Take it slowly at first. Talk to [place] before starting any new exercise program.   Lose it.  -Body Mass Index (BMI) can indicate if you need to lose weight. A healthy range is 18.5-24.9. For a BMI calculator, go to [employer]  Waist Management -Excess abdominal fat is a risk factor for heart disease, diabetes, asthma, stroke and more. Ideal waist circumference is less than 35" for women and less than 40" for men.   Eat Right -focus on fruits, vegetables, whole grains, and meals you make yourself. Avoid foods with trans fat and high sugar/sodium content.   Snooze or Snore? - Loud snoring can be a sign of sleep apnea, a significant risk factor for high blood pressure, heart attach, stroke, and heart arrhythmias.  Are Aspirin and Supplements right for you? -Add ENTERIC COATED low dose 81 mg Aspirin daily OR can do every other day if you have easy  bruising to protect your heart and head. As well as to reduce risk of Colon Cancer by 20 %, Skin Cancer by 26 % , Melanoma by 46% and Pancreatic cancer by 60%  Say "No to Stress -There may be little you can do about problems that cause stress. However, techniques such as long walks, meditation, and exercise can help you manage it.   Start Now! - Make changes one at a time and set reasonable goals to increase your likelihood of success.

## 2019-12-17 NOTE — Progress Notes (Signed)
CPE  Assessment:    Chronic diastolic heart failure (HCC) - continue BP med  tolerating well, monitor BP/weight Go to the ER if any chest pain, shortness of breath, nausea, dizziness, severe HA, changes vision/speech  Bilateral carotid artery stenosis Continue BP medication  Anemia, unspecified type -     Check CBC  CKD (chronic kidney disease), stage III (HCC) -     BASIC METABOLIC PANEL WITH GFR  History of gastric ulcer No bleeding at this time, still on protonix  Check CBC  Medication management -     CBC with Differential/Platelet -     Hepatic function panel -     Magnesium  Mild malnutrition Start on ensure/boost Given samples  Demand ischemia (HCC) -     Lipid panel - no symptoms at this time Continue to monitor BP Discussed symptoms of MI for women  EKG defer  Elevated TSH -     TSH  Medication management -     Magnesium  Vitamin D deficiency -     VITAMIN D 25 Hydroxy (Vit-D Deficiency, Fractures)   Over 40 minutes of exam, counseling, chart review and critical decision making was performed  Future Appointments  Date Time Provider Department Center  03/22/2020 10:00 AM [name], PA-C [facility] None  12/19/2020  2:00 PM [name], PA-C [facility] None     Subjective:  Erica Owen is a 86 y.o. female who presents for CPE and follow up for anemia.   She lives with her daughter, who takes her places. No issues with getting dressed, no issues with eating/cooking. No incontinence.  Daughter does her medications.  Her BP has been controlled on atenolol 50 mg, some fatigue with walking.  BP Readings from Last 3 Encounters:  12/17/19 126/82  09/15/19 130/72  05/25/19 (!) 160/100   She is on thyroid medication. Her medication was not changed last visit.   Lab Results  Component Value Date   TSH 3.65 09/15/2019  .   Had CT head 03/2015 IMPRESSION: Suspect small early infarct at the gray-white junction of the right posterior  frontal -anterior temporal junction. Slight periventricular small vessel disease. 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 No hemorrhage or mass effect.  She does workout, walks alot. She denies chest pain, shortness of breath, no dizziness.  She has a history of upper GI bleed in Nov 2016, had demand ischemia, declined colonoscopy at that time.  Patient denies any pain, dark black stool.   Has aversion to doctors and medicine and still does not want any medications.  She is not on cholesterol medication and denies myalgias. Her cholesterol is at goal. The cholesterol last visit was:   Lab Results  Component Value Date   CHOL 197 03/04/2019   HDL 62 03/04/2019   LDLCALC 115 (H) 03/04/2019   TRIG 101 03/04/2019   CHOLHDL 3.2 03/04/2019   . Last A1C in the office was:  Lab Results  Component Value Date   HGBA1C 5.4 05/25/2019   BMI is Body mass index is 18 kg/m., she is working on diet and exercise. She is still active, not doing ensure anymore, she is doing ice cream. Has no appetite.  Wt Readings from Last 3 Encounters:  12/17/19 100 lb (45.4 kg)  09/15/19 103 lb (46.7 kg)  05/25/19 101 lb 6.4 oz (46 kg)    Medication Review:  Current Outpatient Medications (Endocrine & Metabolic):    levothyroxine (SYNTHROID) 25 MCG tablet, TAKE ONE TABLET BY MOUTH  DAILY ON AN EMPTY STOMACH WITH ONLY WATER FOR 30 MINUTES AND NO ANTACID MEDS, CALCIUM OR MAGNESIUM FOR 4 HOURS AND AVOID BIOTIN  Current Outpatient Medications (Cardiovascular):    atenolol (TENORMIN) 50 MG tablet, TAKE ONE TABLET BY MOUTH DAILY FOR BLOOD PRESSURE   Current Outpatient Medications (Analgesics):    acetaminophen (TYLENOL) 500 MG tablet, Take 500 mg by mouth every 6 (six) hours as needed for mild pain.   aspirin EC 81 MG tablet, Take 81 mg by mouth daily.  Current Outpatient Medications (Hematological):    IRON, FERROUS SULFATE, PO, Take 65 mg by mouth daily.   vitamin B-12 (CYANOCOBALAMIN)  100 MCG tablet, Take 100 mcg by mouth daily.  Current Outpatient Medications (Other):    Ascorbic Acid (VITAMIN C PO), Take 500 mg by mouth daily.   pantoprazole (PROTONIX) 40 MG tablet, TAKE ONE TABLET BY MOUTH DAILY FOR INDIGESTION AND HEARTBURN   VITAMIN D, CHOLECALCIFEROL, PO, Take by mouth.  Current Problems (verified) Patient Active Problem List   Diagnosis Date Noted   Paroxysmal atrial fibrillation (HCC) 04/29/2019   Mild malnutrition (HCC) 04/01/2017   Carotid artery stenosis 03/06/2015   CKD (chronic kidney disease), stage III 03/06/2015   Gastritis 03/06/2015   Elevated TSH 03/05/2015   History of gastric ulcer    Absolute anemia     Screening Tests Preventative care: Last colonoscopy: declines Last mammogram: declines Last pap smear/pelvic exam: declines  DEXA: declines  EGD 03/2015 CT head 2016 CXR 03/2015 Echo 03/2015 55-60%, grade 1 diastolic  Prior vaccinations: TD or Tdap: declines  Influenza: 2020 Pneumococcal: declines Prevnar13: declines Shingles/Zostavax: declines COVID completed  Names of Other Physician/Practitioners you currently use: 1.  [facility] here for primary care 2. None, eye doctor, last visit declines 3. None, dentist, has dentures.  Dr. Rhea Belton Patient Care Team: Lucky Cowboy, MD as PCP - General (Internal Medicine)  Allergies No Known Allergies  SURGICAL HISTORY She  has a past surgical history that includes Abdominal hysterectomy and Esophagogastroduodenoscopy (egd) with propofol (N/A, 03/03/2015). FAMILY HISTORY Her family history includes Colon cancer in her brother; Heart attack in her father; Kidney failure in her mother; Lupus in her mother; Pancreatic cancer in her sister. SOCIAL HISTORY She  reports that she quit smoking about 54 years ago. Her smoking use included cigarettes. She has never used smokeless tobacco. She reports that she does not drink alcohol and does  not use drugs.   Review of Systems  Constitutional: Negative.   HENT: Negative.   Eyes: Negative.   Respiratory: Negative.   Cardiovascular: Negative.   Gastrointestinal: Negative.   Genitourinary: Negative.   Musculoskeletal: Negative.   Skin: Negative.   Neurological: Negative.   Endo/Heme/Allergies: Negative.   Psychiatric/Behavioral: Negative.      Objective:     Today's Vitals   12/17/19 1354  BP: 126/82  Pulse: 66  Temp: (!) 97.5 F (36.4 C)  SpO2: 96%  Weight: 100 lb (45.4 kg)  Height: 5' 2.5" (1.588 m)   Body mass index is 18 kg/m.  General appearance: alert, no distress, WD/WN, female HEENT: normocephalic, sclerae anicteric, TMs pearly, nares patent, no discharge or erythema, pharynx normal Oral cavity: MMM, no lesions Neck: supple, no lymphadenopathy, no thyromegaly, no masses Heart: RRR, normal S1, S2, no murmurs Lungs: CTA bilaterally, no wheezes, rhonchi, or rales Abdomen: +bs, soft, non tender, non distended, no masses, no hepatomegaly, no splenomegaly Musculoskeletal: nontender, no swelling, no obvious deformity Extremities: no edema, no cyanosis, no  clubbing Pulses: 2+ symmetric, upper and lower extremities, normal cap refill Neurological: alert, oriented x 3, CN2-12 intact, strength normal upper extremities and lower extremities, sensation normal throughout, DTRs 2+ throughout, no cerebellar signs, gait normal Psychiatric: normal affect, behavior normal, pleasant    [name], PA-C   12/17/2019

## 2020-02-19 ENCOUNTER — Other Ambulatory Visit: Payer: Self-pay | Admitting: Internal Medicine

## 2020-02-19 DIAGNOSIS — I480 Paroxysmal atrial fibrillation: Secondary | ICD-10-CM

## 2020-03-15 ENCOUNTER — Ambulatory Visit: Payer: Medicare Other | Admitting: Physician Assistant

## 2020-03-16 ENCOUNTER — Encounter: Payer: Self-pay | Admitting: Adult Health Nurse Practitioner

## 2020-03-16 ENCOUNTER — Other Ambulatory Visit: Payer: Self-pay

## 2020-03-16 ENCOUNTER — Ambulatory Visit (INDEPENDENT_AMBULATORY_CARE_PROVIDER_SITE_OTHER): Payer: Medicare Other | Admitting: Adult Health Nurse Practitioner

## 2020-03-16 VITALS — BP 110/68 | Temp 97.6°F | Ht 62.0 in | Wt 102.0 lb

## 2020-03-16 DIAGNOSIS — I248 Other forms of acute ischemic heart disease: Secondary | ICD-10-CM | POA: Diagnosis not present

## 2020-03-16 DIAGNOSIS — E441 Mild protein-calorie malnutrition: Secondary | ICD-10-CM

## 2020-03-16 DIAGNOSIS — I5032 Chronic diastolic (congestive) heart failure: Secondary | ICD-10-CM

## 2020-03-16 DIAGNOSIS — R6889 Other general symptoms and signs: Secondary | ICD-10-CM | POA: Diagnosis not present

## 2020-03-16 DIAGNOSIS — N183 Chronic kidney disease, stage 3 unspecified: Secondary | ICD-10-CM | POA: Diagnosis not present

## 2020-03-16 DIAGNOSIS — R300 Dysuria: Secondary | ICD-10-CM | POA: Diagnosis not present

## 2020-03-16 DIAGNOSIS — E039 Hypothyroidism, unspecified: Secondary | ICD-10-CM | POA: Diagnosis not present

## 2020-03-16 DIAGNOSIS — Z79899 Other long term (current) drug therapy: Secondary | ICD-10-CM

## 2020-03-16 DIAGNOSIS — Z0001 Encounter for general adult medical examination with abnormal findings: Secondary | ICD-10-CM | POA: Diagnosis not present

## 2020-03-16 DIAGNOSIS — I10 Essential (primary) hypertension: Secondary | ICD-10-CM | POA: Diagnosis not present

## 2020-03-16 DIAGNOSIS — Z0000 Encounter for general adult medical examination without abnormal findings: Secondary | ICD-10-CM

## 2020-03-16 DIAGNOSIS — E559 Vitamin D deficiency, unspecified: Secondary | ICD-10-CM

## 2020-03-16 DIAGNOSIS — E538 Deficiency of other specified B group vitamins: Secondary | ICD-10-CM

## 2020-03-16 DIAGNOSIS — I2489 Other forms of acute ischemic heart disease: Secondary | ICD-10-CM

## 2020-03-16 DIAGNOSIS — R7309 Other abnormal glucose: Secondary | ICD-10-CM

## 2020-03-16 DIAGNOSIS — I6523 Occlusion and stenosis of bilateral carotid arteries: Secondary | ICD-10-CM

## 2020-03-16 DIAGNOSIS — E782 Mixed hyperlipidemia: Secondary | ICD-10-CM

## 2020-03-16 NOTE — Patient Instructions (Signed)
   Increase the amount of water you drink every day, at least 4 bottles of water.  For every pepsi drink a bottle of water.   Check your blood pressure once every couple week OR IF you feel bad or dizzy.   Try drinking Ensure in between lunch and dinner and after dinner.  These should be in addition to meals.    GENERAL HEALTH GOALS  Know what a healthy weight is for you (roughly BMI <25) and aim to maintain this  Aim for 7+ servings of fruits and vegetables daily  70-80+ fluid ounces of water or unsweet tea for healthy kidneys  Limit to max 1 drink of alcohol per day; avoid smoking/tobacco  Limit animal fats in diet for cholesterol and heart health - choose grass fed whenever available  Avoid highly processed foods, and foods high in saturated/trans fats  Aim for low stress - take time to unwind and care for your mental health  Aim for 150 min of moderate intensity exercise weekly for heart health, and weights twice weekly for bone health  Aim for 7-9 hours of sleep daily

## 2020-03-16 NOTE — Progress Notes (Signed)
MEDICARE ANNUAL WELLNESS  Assessment:   Erica Owen was seen today for medicare wellness.  Diagnoses and all orders for this visit:  Encounter for Medicare annual wellness exam Yearly  Chronic diastolic heart failure (HCC) Disease process and medications discussed. Questions answered fully. Emphasized salt restriction, less than 2000mg  a day. Encouraged daily monitoring of the patient's weight, call office if 5 lb weight loss or gain in a day.  Encouraged regular exercise. If any increasing shortness of breath, swelling, or chest pressure go to ER immediately.  decrease your fluid intake to less than 2 L daily please remember to always increase your potassium intake with any increase of your fluid pill.   Essential hypertension Continue current medications: atenolol 50mg  daily in am Monitor blood pressure at home; call if consistently over 130/80 Continue DASH diet.   Reminder to go to the ER if any CP, SOB, nausea, dizziness, severe HA, changes vision/speech, left arm numbness and tingling and jaw pain. -     CBC with Differential/Platelet -     COMPLETE METABOLIC PANEL WITH GFR  Paroxysmal atrial fibrillation (HCC) Follows with cardiology Continued bASA  No falls Previously on xarelto, has stopped. Discussed hospital precautions with patient Will continue to monitor  Stage 3 chronic kidney disease, unspecified whether stage 3a or 3b CKD (HCC) Increase fluids  Avoid NSAIDS Blood pressure control Monitor sugars  Will continue to monitor  Mild malnutrition (HCC) Discussed adding ensure between meals  Demand ischemia (HCC) - no symptoms at this time Continue to monitor BP Discussed symptoms of MI for women   Hyperlipidemia, mixed No medications Discussed dietary and exercise modifications Low fat diet Defer lab today  Dysuria -     Urinalysis w microscopic + reflex cultur  Hypothyroidism, unspecified type Taking levothyroxine mcg daily Reminder to take on  an empty stomach 30-48mins before first meal of the day. No antacid medications for 4 hours. -     TSH  Bilateral carotid artery stenosis Continue BP meds Monitor  Abnormal glucose Discussed dietary and exercise modifications  B12 deficiency Continue supplementation  Vitamin D deficiency Continue supplementation to maintain goal of 70-100 Taking Vitamin D daily Defer vitamin D level  Medication management Continued    Further disposition pending results if labs check today. Discussed med's effects and SE's.   Over 30 minutes of face to face interview, exam, counseling, chart review, and critical decision making was performed.    Future Appointments  Date Time Provider Department Center  12/19/2020  2:00 PM 74m, NP [facility] None  03/16/2021 11:30 AM Judd Gaudier, NP [facility] None     Subjective:  Erica Owen is a 86 y.o. female who presents for MEDICARE WELLNESS and follow up for anemia.   She lives with her daughter, who takes her places. No issues with getting dressed, no issues with eating/cooking. No incontinence.  Daughter manages her medications.  She is accompanying her to appointment today.  She reports that her mother continues to have short term memory deficit at times.  She can complete her ADL's but needs direction to do so and not self motivated.  Reports her mother lives on the ground level of the home and she does not need to go up stairs in the house.   Her blood pressure has not been checked at home, today their BP is BP: 110/68. She is not on any NSAIDS, cold medication. She denies any associated neurological complications or symptoms, such as one-sided weakness, numbness, tingling, slurring of  speech, droopy face, swallowing difficulties, diplopia, vision loss, hearing loss or tinnitus.   BP Readings from Last 3 Encounters:  03/16/20 110/68  12/17/19 126/82  09/15/19 130/72    Had CT head 03/2015 IMPRESSION: Suspect small early  infarct at the gray-white junction of the right posterior frontal -anterior temporal junction. Slight periventricular small vessel disease. 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 No hemorrhage or mass effect.  She does not workout, walks a lot in the home. She denies chest pain, shortness of breath, no dizziness.  She has a history of upper GI bleed in Nov 2016, had demand ischemia, declined colonoscopy at that time.  Has aversion to doctors and medicine and still does not want any medications.  She is not on cholesterol medication and denies myalgias. Her cholesterol is at goal. The cholesterol last visit was:   Lab Results  Component Value Date   CHOL 182 12/17/2019   HDL 51 12/17/2019   LDLCALC 110 (H) 12/17/2019   TRIG 100 12/17/2019   CHOLHDL 3.6 12/17/2019   . Last A1C in the office was:  Lab Results  Component Value Date   HGBA1C 5.4 05/25/2019   BMI is Body mass index is 18.66 kg/m., she is working on diet and exercise. She is still active, daughter has been giving her Ensure supplements with some icecream or a milkshake.  She find that at times she then will not eat her meal later in the day.  Daughter prepares meals for her. Has no appetite. Her weight is up two pounds since last OV.  Wt Readings from Last 3 Encounters:  03/16/20 102 lb (46.3 kg)  12/17/19 100 lb (45.4 kg)  09/15/19 103 lb (46.7 kg)    Medication Review: Current Outpatient Medications on File Prior to Visit  Medication Sig Dispense Refill  . acetaminophen (TYLENOL) 500 MG tablet Take 500 mg by mouth every 6 (six) hours as needed for mild pain.    . Ascorbic Acid (VITAMIN C PO) Take 500 mg by mouth daily.    Marland Kitchen aspirin EC 81 MG tablet Take 81 mg by mouth daily.    Marland Kitchen atenolol (TENORMIN) 50 MG tablet TAKE ONE TABLET BY MOUTH DAILY FOR BLOOD PRESSURE 90 tablet 0  . IRON, FERROUS SULFATE, PO Take 65 mg by mouth daily.    Marland Kitchen levothyroxine (SYNTHROID) 25 MCG tablet TAKE ONE TABLET BY MOUTH  DAILY ON AN EMPTY STOMACH WITH ONLY WATER FOR 30 MINUTES AND NO ANTACID MEDS, CALCIUM OR MAGNESIUM FOR 4 HOURS AND AVOID BIOTIN 90 tablet 0  . pantoprazole (PROTONIX) 40 MG tablet TAKE ONE TABLET BY MOUTH DAILY FOR INDIGESTION AND HEARTBURN 90 tablet 1  . vitamin B-12 (CYANOCOBALAMIN) 100 MCG tablet Take 100 mcg by mouth daily.    Marland Kitchen VITAMIN D, CHOLECALCIFEROL, PO Take by mouth.     No current facility-administered medications on file prior to visit.    Current Problems (verified) Patient Active Problem List   Diagnosis Date Noted  . Paroxysmal atrial fibrillation (HCC) 04/29/2019  . Mild malnutrition (HCC) 04/01/2017  . Carotid artery stenosis 03/06/2015  . CKD (chronic kidney disease), stage III (HCC) 03/06/2015  . Gastritis 03/06/2015  . Elevated TSH 03/05/2015  . History of gastric ulcer   . Absolute anemia     Screening Tests Preventative care: Last colonoscopy: declines Last mammogram: declines Last pap smear/pelvic exam: declines  DEXA: declines  EGD 03/2015 CT head 2016 CXR 03/2015 Echo 03/2015 55-60%, grade 1 diastolic  Prior vaccinations:  TD or Tdap: declines  Influenza: 01/2020   Pneumococcal: declines Prevnar13: declines Shingles/Zostavax: declines  Names of Other Physician/Practitioners you currently use: 1. [facility] here for primary care 2. None, eye doctor, last visit declines 3. None, dentist, has dentures.  Dr. Rhea BeltonPyrtle, GI Patient Care Team: Lucky CowboyMcKeown, William, MD as PCP - General (Internal Medicine)  Allergies No Known Allergies  SURGICAL HISTORY She  has a past surgical history that includes Abdominal hysterectomy and Esophagogastroduodenoscopy (egd) with propofol (N/A, 03/03/2015). FAMILY HISTORY Her family history includes Colon cancer in her brother; Heart attack in her father; Kidney failure in her mother; Lupus in her mother; Pancreatic cancer in her sister. SOCIAL HISTORY She  reports that she quit  smoking about 55 years ago. Her smoking use included cigarettes. She has never used smokeless tobacco. She reports that she does not drink alcohol and does not use drugs.  MEDICARE WELLNESS OBJECTIVES: Physical activity: Current Exercise Habits: The patient does not participate in regular exercise at present, Exercise limited by: None identified Cardiac risk factors: Cardiac Risk Factors include: advanced age (>1555men, 13>65 women);dyslipidemia;hypertension;sedentary lifestyle Depression/mood screen:   [facility] 03/16/2020  Decreased Interest 0  Down, Depressed, Hopeless 0  PHQ - 2 Score 0    ADLs:  In your present state of health, do you have any difficulty performing the following activities: 03/16/2020 05/24/2019  Hearing? N N  Vision? N N  Difficulty concentrating or making decisions? N N  Walking or climbing stairs? Y N  Comment Usually someone behind her. -  Dressing or bathing? N N  Doing errands, shopping? N N  Preparing Food and eating ? Y -  Comment Daughters -  Using the Toilet? N -  In the past six months, have you accidently leaked urine? N -  Do you have problems with loss of bowel control? N -  Managing your Medications? Y -  Comment Daughter assist with that. -  Managing your Finances? N -  Housekeeping or managing your Housekeeping? N -  Some recent data might be hidden     Cognitive Testing  Alert? Yes  Normal Appearance?Yes  Oriented to person? Yes  Place? Yes   Time? Yes  Recall of three objects?  Yes  Can perform simple calculations? Yes  Displays appropriate judgment?Yes  Can read the correct time from a watch face?Yes  EOL planning: Does Patient Have a Medical Advance Directive?: No Would patient like information on creating a medical advance directive?: Yes (MAU/Ambulatory/Procedural Areas - Information given)   Review of Systems  Constitutional: Negative.   HENT: Negative.   Eyes: Negative.   Respiratory: Negative.    Cardiovascular: Negative.   Gastrointestinal: Negative.   Genitourinary: Negative.   Musculoskeletal: Negative.   Skin: Negative.   Neurological: Negative.   Endo/Heme/Allergies: Negative.   Psychiatric/Behavioral: Negative.      Objective:     Today's Vitals   03/16/20 1129  BP: 110/68  Temp: 97.6 F (36.4 C)  Weight: 102 lb (46.3 kg)  Height: 5\' 2"  (1.575 m)  PainSc: 0-No pain   Body mass index is 18.66 kg/m.  General appearance: alert, thin fraile no distress, WD/WN, female HEENT: normocephalic, sclerae anicteric, TMs pearly, nares patent, no discharge or erythema, pharynx normal Oral cavity: MMM, no lesions Neck: supple, no lymphadenopathy, no thyromegaly, no masses Heart: RRR, normal S1, S2, no murmurs Lungs: CTA bilaterally, no wheezes, rhonchi, or rales Abdomen: +bs, soft, non tender, non distended, no masses, no  hepatomegaly, no splenomegaly Musculoskeletal: nontender, no swelling, no obvious deformity Extremities: no edema, no cyanosis, no clubbing Pulses: 2+ symmetric, upper and lower extremities, normal cap refill Neurological: alert, oriented x 3, CN2-12 intact, strength normal upper extremities and lower extremities, sensation normal throughout, DTRs 2+ throughout, no cerebellar signs, gait normal Psychiatric: normal affect, behavior normal, pleasant    Medicare Attestation I have personally reviewed: The patient's medical and social history Their use of alcohol, tobacco or illicit drugs Their current medications and supplements The patient's functional ability including ADLs,fall risks, home safety risks, cognitive, and hearing and visual impairment Diet and physical activities Evidence for depression or mood disorders  The patient's weight, height, BMI, and visual acuity have been recorded in the chart.  I have made referrals, counseling, and provided education to the patient based on review of the above and I have provided the patient with a written  personalized care plan for preventive services.      Elder Negus, Edrick Oh, DNP [facility] 03/16/2020  12:37 PM

## 2020-03-18 LAB — URINALYSIS W MICROSCOPIC + REFLEX CULTURE
Bacteria, UA: NONE SEEN /HPF
Bilirubin Urine: NEGATIVE
Glucose, UA: NEGATIVE
Hgb urine dipstick: NEGATIVE
Hyaline Cast: NONE SEEN /LPF
Ketones, ur: NEGATIVE
Nitrites, Initial: NEGATIVE
Protein, ur: NEGATIVE
RBC / HPF: NONE SEEN /HPF (ref 0–2)
Specific Gravity, Urine: 1.008 (ref 1.001–1.03)
Squamous Epithelial / HPF: NONE SEEN /HPF (ref <=5)
pH: 6 (ref 5.0–8.0)

## 2020-03-18 LAB — CBC WITH DIFFERENTIAL/PLATELET
Absolute Monocytes: 696 cells/uL (ref 200–950)
Basophils Absolute: 37 cells/uL (ref 0–200)
Basophils Relative: 0.5 %
Eosinophils Absolute: 141 cells/uL (ref 15–500)
Eosinophils Relative: 1.9 %
HCT: 37.5 % (ref 35.0–45.0)
Hemoglobin: 12.4 g/dL (ref 11.7–15.5)
Lymphs Abs: 1369 cells/uL (ref 850–3900)
MCH: 29.7 pg (ref 27.0–33.0)
MCHC: 33.1 g/dL (ref 32.0–36.0)
MCV: 89.9 fL (ref 80.0–100.0)
MPV: 11.1 fL (ref 7.5–12.5)
Monocytes Relative: 9.4 %
Neutro Abs: 5158 cells/uL (ref 1500–7800)
Neutrophils Relative %: 69.7 %
Platelets: 216 10*3/uL (ref 140–400)
RBC: 4.17 10*6/uL (ref 3.80–5.10)
RDW: 13.3 % (ref 11.0–15.0)
Total Lymphocyte: 18.5 %
WBC: 7.4 10*3/uL (ref 3.8–10.8)

## 2020-03-18 LAB — URINE CULTURE
MICRO NUMBER:: 11218201
SPECIMEN QUALITY:: ADEQUATE

## 2020-03-18 LAB — COMPLETE METABOLIC PANEL WITH GFR
AG Ratio: 1.4 (calc) (ref 1.0–2.5)
ALT: 10 U/L (ref 6–29)
AST: 14 U/L (ref 10–35)
Albumin: 4 g/dL (ref 3.6–5.1)
Alkaline phosphatase (APISO): 124 U/L (ref 37–153)
BUN: 14 mg/dL (ref 7–25)
CO2: 30 mmol/L (ref 20–32)
Calcium: 10 mg/dL (ref 8.6–10.4)
Chloride: 103 mmol/L (ref 98–110)
Creat: 0.71 mg/dL (ref 0.60–0.88)
GFR, Est African American: 89 mL/min/{1.73_m2} (ref >=60)
GFR, Est Non African American: 77 mL/min/{1.73_m2} (ref >=60)
Globulin: 2.8 g/dL (calc) (ref 1.9–3.7)
Glucose, Bld: 94 mg/dL (ref 65–99)
Potassium: 4 mmol/L (ref 3.5–5.3)
Sodium: 142 mmol/L (ref 135–146)
Total Bilirubin: 0.8 mg/dL (ref 0.2–1.2)
Total Protein: 6.8 g/dL (ref 6.1–8.1)

## 2020-03-18 LAB — TSH: TSH: 3.41 mIU/L (ref 0.40–4.50)

## 2020-03-18 LAB — CULTURE INDICATED

## 2020-03-22 ENCOUNTER — Ambulatory Visit: Payer: Medicare Other | Admitting: Adult Health Nurse Practitioner

## 2020-03-27 ENCOUNTER — Other Ambulatory Visit: Payer: Self-pay | Admitting: Internal Medicine

## 2020-03-27 DIAGNOSIS — E039 Hypothyroidism, unspecified: Secondary | ICD-10-CM

## 2020-03-27 MED ORDER — LEVOTHYROXINE SODIUM 25 MCG PO TABS: ORAL_TABLET | ORAL | 0 refills | Status: DC

## 2020-05-21 ENCOUNTER — Other Ambulatory Visit: Payer: Self-pay | Admitting: Internal Medicine

## 2020-05-21 DIAGNOSIS — I480 Paroxysmal atrial fibrillation: Secondary | ICD-10-CM

## 2020-07-05 ENCOUNTER — Other Ambulatory Visit: Payer: Self-pay | Admitting: Internal Medicine

## 2020-07-05 DIAGNOSIS — E039 Hypothyroidism, unspecified: Secondary | ICD-10-CM

## 2020-07-05 MED ORDER — LEVOTHYROXINE SODIUM 25 MCG PO TABS: ORAL_TABLET | ORAL | 1 refills | Status: AC

## 2020-07-18 ENCOUNTER — Other Ambulatory Visit: Payer: Self-pay

## 2020-07-18 ENCOUNTER — Encounter: Payer: Self-pay | Admitting: Adult Health Nurse Practitioner

## 2020-07-18 ENCOUNTER — Ambulatory Visit (INDEPENDENT_AMBULATORY_CARE_PROVIDER_SITE_OTHER): Payer: Medicare Other | Admitting: Adult Health Nurse Practitioner

## 2020-07-18 VITALS — BP 116/68 | Temp 97.7°F | Wt 103.0 lb

## 2020-07-18 DIAGNOSIS — Z7409 Other reduced mobility: Secondary | ICD-10-CM

## 2020-07-18 DIAGNOSIS — I10 Essential (primary) hypertension: Secondary | ICD-10-CM | POA: Diagnosis not present

## 2020-07-18 DIAGNOSIS — R300 Dysuria: Secondary | ICD-10-CM | POA: Diagnosis not present

## 2020-07-18 DIAGNOSIS — E039 Hypothyroidism, unspecified: Secondary | ICD-10-CM

## 2020-07-18 DIAGNOSIS — N183 Chronic kidney disease, stage 3 unspecified: Secondary | ICD-10-CM | POA: Diagnosis not present

## 2020-07-18 DIAGNOSIS — R531 Weakness: Secondary | ICD-10-CM

## 2020-07-18 NOTE — Patient Instructions (Signed)
  We have placed an order for physical therapy, occupational therapy to assess Chaya in the home.  Also orders for assessment and treatment of balance and strengthening. Requested recommendations of equipment needed.  Let us know if we can assist with any orders to complete this for you.  We will contact in 1-3 days with lab results, sooner if needed.  Try splitting up the ensure, half after breakfast an half after lunch to improve nutritional intake while not filling her up for meals.   If feet are swelling, elevated them above her heart when sitting.  Also increase water intake.  After a glass on Dr Reino Kent, then fill with water.   Let us know if she has any new or worsening symptoms.

## 2020-07-18 NOTE — Progress Notes (Signed)
Assessment and Plan:  Erica Owen was seen today for foot swelling.  Diagnoses and all orders for this visit:  Hypothyroidism, unspecified type Taking levothyroxine 25 mcg daily Reminder to take on an empty stomach 30-61mins before first meal of the day. No antacid medications for 4 hours. -     TSH  Essential hypertension Continue current medications: atenolol Monitor blood pressure at home; call if consistently over 130/80 Continue DASH diet.   Reminder to go to the ER if any CP, SOB, nausea, dizziness, severe HA, changes vision/speech, left arm numbness and tingling and jaw pain. -     CBC with Differential/Platelet -     COMPLETE METABOLIC PANEL WITH GFR  Stage 3 chronic kidney disease, unspecified whether stage 3a or 3b CKD (HCC) Increase fluids  Avoid NSAIDS Blood pressure control Monitor sugars  Will continue to monitor  Dysuria -     Urinalysis w microscopic + reflex cultur  Impaired functional mobility, balance, and endurance Weakness -     Ambulatory referral to Home Health Discussed use of possible equipment, raise toilet seat, walker or rolling walker and wheelchair for appointments out of the home or long distances to improve safety.   Contact office with any new or worsening symptoms.    Further disposition pending results of labs. Discussed med's effects and SE's.   Over 30 minutes of face to face interview, exam, counseling, chart review, and critical decision making was performed.   Future Appointments  Date Time Provider Department Center  12/19/2020  2:00 PM Judd Gaudier, NP [facility] None  03/16/2021 11:30 AM [name], [name], NP [facility] None    ------------------------------------------------------------------------------------------------------------------   HPI 87 y.o.female presents for evaluation of swelling of her feet, mainly the left.  Daughters reports her balance was off related to this   She also was having some decreased  strength. She is accompanied by her daughters today.  Patient is pleasant, alert, oriented x2.  She does have mild cognitive decline with short term memory impairment.  Patient denies any pain, shortness of breath, chest pains, headaches, dizziness,  nausea, vomiting, diarrhea or constipation.  Diet Dr Reino Kent, 3-4 8oz glasses and about two cups of coffee are her favorite drinks.  She does drink about one bottle of water a day and water with her medications in the morning.  She is eating three meals a day appetite is hit or miss, daughters report.  She is also taking ensure daily with ice cream. Unable to get her drink this daily as then she will not eat hear meal as she is full.    Past Medical History:  Diagnosis Date  . Abnormal EKG 03/02/2015  . Acute blood loss anemia 03/02/2015  . Acute encephalopathy 03/02/2015  . Arthritis   . Carotid artery stenosis 03/06/2015  . CKD (chronic kidney disease), stage III (HCC) 03/06/2015  . Demand ischemia (HCC) 03/05/2015  . Gastric ulcer   . Gastritis 03/06/2015  . Hypertension   . IDA (iron deficiency anemia)   . Thyroid disease      No Known Allergies  Current Outpatient Medications on File Prior to Visit  Medication Sig  . acetaminophen (TYLENOL) 500 MG tablet Take 500 mg by mouth every 6 (six) hours as needed for mild pain.  . Ascorbic Acid (VITAMIN C PO) Take 500 mg by mouth daily.  Marland Kitchen aspirin EC 81 MG tablet Take 81 mg by mouth daily.  Marland Kitchen atenolol (TENORMIN) 50 MG tablet TAKE ONE TABLET BY MOUTH DAILY FOR BLOOD  PRESSURE  . IRON, FERROUS SULFATE, PO Take 65 mg by mouth daily.  Marland Kitchen levothyroxine (SYNTHROID) 25 MCG tablet Take  1 tablet  Daily  on an empty stomach with only water for 30 minutes & no Antacid meds, Calcium or Magnesium for 4 hours & avoid Biotin  . pantoprazole (PROTONIX) 40 MG tablet TAKE ONE TABLET BY MOUTH DAILY FOR INDIGESTION AND HEARTBURN  . vitamin B-12 (CYANOCOBALAMIN) 100 MCG tablet Take 100 mcg by mouth daily.  Marland Kitchen VITAMIN D,  CHOLECALCIFEROL, PO Take by mouth.   No current facility-administered medications on file prior to visit.    ROS: all negative except above.   Physical Exam:  BP 116/68   Temp 97.7 F (36.5 C)   Wt 103 lb (46.7 kg)   BMI 18.84 kg/m   General Appearance: Thin, frail, in no apparent distress. Eyes: PERRLA, EOMs, conjunctiva no swelling or erythema Sinuses: No Frontal/maxillary tenderness ENT/Mouth: Ext aud canals clear, TMs without erythema, bulging. No erythema, swelling, or exudate on post pharynx.  Tonsils not swollen or erythematous. Hearing normal.  Neck: Supple, thyroid normal.  Respiratory: Respiratory effort normal, BS equal bilaterally without rales, rhonchi, wheezing or stridor.  Cardio: RRR with no MRGs. Brisk peripheral pulses, mild edema to left foot, localized. Abdomen: Soft, + BS.  Non tender, no guarding, rebound, hernias, masses. Musculoskeletal: Full ROM, 4/5 strength, gait unsteady requiring assistance from family.  Skin: Warm, dry without rashes, lesions, ecchymosis.  Neuro: Cranial nerves intact. Normal muscle tone, no cerebellar symptoms. Sensation intact.  Psych: Awake and oriented X 3, normal affect, Insight and Judgment appropriate.    Elder Negus, Edrick Oh, DNP [facility] 07/18/2020  11:26 AM

## 2020-07-19 LAB — URINALYSIS W MICROSCOPIC + REFLEX CULTURE
Bacteria, UA: NONE SEEN /HPF
Bilirubin Urine: NEGATIVE
Glucose, UA: NEGATIVE
Hgb urine dipstick: NEGATIVE
Hyaline Cast: NONE SEEN /LPF
Ketones, ur: NEGATIVE
Leukocyte Esterase: NEGATIVE
Nitrites, Initial: NEGATIVE
Specific Gravity, Urine: 1.018 (ref 1.001–1.03)
Squamous Epithelial / HPF: NONE SEEN /HPF (ref <=5)
pH: 6 (ref 5.0–8.0)

## 2020-07-19 LAB — COMPLETE METABOLIC PANEL WITH GFR
AG Ratio: 1.3 (calc) (ref 1.0–2.5)
ALT: 28 U/L (ref 6–29)
AST: 29 U/L (ref 10–35)
Albumin: 3.7 g/dL (ref 3.6–5.1)
Alkaline phosphatase (APISO): 148 U/L (ref 37–153)
BUN/Creatinine Ratio: 22 (calc) (ref 6–22)
BUN: 11 mg/dL (ref 7–25)
CO2: 30 mmol/L (ref 20–32)
Calcium: 9.3 mg/dL (ref 8.6–10.4)
Chloride: 102 mmol/L (ref 98–110)
Creat: 0.49 mg/dL — ABNORMAL LOW (ref 0.60–0.88)
GFR, Est African American: 102 mL/min/{1.73_m2} (ref >=60)
GFR, Est Non African American: 88 mL/min/{1.73_m2} (ref >=60)
Globulin: 2.9 g/dL (calc) (ref 1.9–3.7)
Glucose, Bld: 78 mg/dL (ref 65–99)
Potassium: 4.3 mmol/L (ref 3.5–5.3)
Sodium: 141 mmol/L (ref 135–146)
Total Bilirubin: 1.3 mg/dL — ABNORMAL HIGH (ref 0.2–1.2)
Total Protein: 6.6 g/dL (ref 6.1–8.1)

## 2020-07-19 LAB — CBC WITH DIFFERENTIAL/PLATELET
Absolute Monocytes: 1316 cells/uL — ABNORMAL HIGH (ref 200–950)
Basophils Absolute: 32 cells/uL (ref 0–200)
Basophils Relative: 0.3 %
Eosinophils Absolute: 21 cells/uL (ref 15–500)
Eosinophils Relative: 0.2 %
HCT: 36.8 % (ref 35.0–45.0)
Hemoglobin: 12.1 g/dL (ref 11.7–15.5)
Lymphs Abs: 1177 cells/uL (ref 850–3900)
MCH: 29.3 pg (ref 27.0–33.0)
MCHC: 32.9 g/dL (ref 32.0–36.0)
MCV: 89.1 fL (ref 80.0–100.0)
MPV: 11.7 fL (ref 7.5–12.5)
Monocytes Relative: 12.3 %
Neutro Abs: 8153 cells/uL — ABNORMAL HIGH (ref 1500–7800)
Neutrophils Relative %: 76.2 %
Platelets: 172 10*3/uL (ref 140–400)
RBC: 4.13 10*6/uL (ref 3.80–5.10)
RDW: 13.2 % (ref 11.0–15.0)
Total Lymphocyte: 11 %
WBC: 10.7 10*3/uL (ref 3.8–10.8)

## 2020-07-19 LAB — TSH: TSH: 4.4 mIU/L (ref 0.40–4.50)

## 2020-07-19 LAB — NO CULTURE INDICATED

## 2020-07-21 ENCOUNTER — Emergency Department (HOSPITAL_COMMUNITY)
Admission: EM | Admit: 2020-07-21 | Discharge: 2020-07-29 | Disposition: E | Payer: Medicare Other | Attending: Emergency Medicine | Admitting: Emergency Medicine

## 2020-07-21 ENCOUNTER — Emergency Department (HOSPITAL_COMMUNITY): Payer: Medicare Other

## 2020-07-21 ENCOUNTER — Encounter (HOSPITAL_COMMUNITY): Payer: Self-pay | Admitting: Emergency Medicine

## 2020-07-21 DIAGNOSIS — N183 Chronic kidney disease, stage 3 unspecified: Secondary | ICD-10-CM | POA: Diagnosis not present

## 2020-07-21 DIAGNOSIS — Z7982 Long term (current) use of aspirin: Secondary | ICD-10-CM | POA: Diagnosis not present

## 2020-07-21 DIAGNOSIS — I129 Hypertensive chronic kidney disease with stage 1 through stage 4 chronic kidney disease, or unspecified chronic kidney disease: Secondary | ICD-10-CM | POA: Insufficient documentation

## 2020-07-21 DIAGNOSIS — Z79899 Other long term (current) drug therapy: Secondary | ICD-10-CM | POA: Diagnosis not present

## 2020-07-21 DIAGNOSIS — Z87891 Personal history of nicotine dependence: Secondary | ICD-10-CM | POA: Diagnosis not present

## 2020-07-21 DIAGNOSIS — I469 Cardiac arrest, cause unspecified: Secondary | ICD-10-CM | POA: Insufficient documentation

## 2020-07-21 DIAGNOSIS — I639 Cerebral infarction, unspecified: Secondary | ICD-10-CM | POA: Insufficient documentation

## 2020-07-21 DIAGNOSIS — I480 Paroxysmal atrial fibrillation: Secondary | ICD-10-CM | POA: Diagnosis not present

## 2020-07-21 DIAGNOSIS — R000 Tachycardia, unspecified: Secondary | ICD-10-CM | POA: Diagnosis not present

## 2020-07-21 MED ORDER — SODIUM CHLORIDE 0.9 % IV SOLN
INTRAVENOUS | Status: AC | PRN
  Administered 2020-07-21: 1000 mL via INTRAVENOUS

## 2020-07-21 MED ORDER — EPINEPHRINE 1 MG/10ML IJ SOSY
PREFILLED_SYRINGE | INTRAMUSCULAR | Status: AC | PRN
  Administered 2020-07-21 (×3): 1 mg via INTRAVENOUS

## 2020-07-21 MED ORDER — SODIUM BICARBONATE 8.4 % IV SOLN
INTRAVENOUS | Status: AC | PRN
Start: 2020-07-21 — End: 2020-07-21
  Administered 2020-07-21: 50 meq via INTRAVENOUS

## 2020-07-21 MED ORDER — EPINEPHRINE 1 MG/10ML IJ SOSY
PREFILLED_SYRINGE | INTRAMUSCULAR | Status: AC | PRN
  Administered 2020-07-21: 1 mg via INTRAVENOUS

## 2020-07-21 MED ORDER — SODIUM CHLORIDE 0.9 % IV BOLUS: 1000.0000 mL | Freq: Once | INTRAVENOUS | Status: DC

## 2020-07-21 NOTE — ED Provider Notes (Signed)
[facility] EMERGENCY DEPARTMENT Provider Note   CSN: [id number] Arrival date & time: 07/25/2020  2134     History Chief Complaint  Patient presents with  . Cardiac Arrest    Erica Owen is a 87 y.o. female.  Patient is a 87 year old female who presents as a post cardiac arrest.  It is reported that patient was talking with her daughter at home.  All of a sudden she started gasping for air and apneic breathing.  He quickly became unresponsive and stopped breathing.  They immediately called EMS.  CPR was started by family at home.  When EMS got there they resumed CPR and noted her rhythm to be asystole.  Patient got 3 doses of epi with EMS while doing CPR.  EMS, pulses back while in route.  At 1 point they were transcutaneously pacing patient.  On arrival, patient with strong pulses noted at the bilateral carotids.  She was moved over to our bed.  Placed on ZOLL pads.  She is unresponsive.  No definitive airway intact.  Critically ill.    Past Medical History:  Diagnosis Date  . Abnormal EKG 03/02/2015  . Acute blood loss anemia 03/02/2015  . Acute encephalopathy 03/02/2015  . Arthritis   . Carotid artery stenosis 03/06/2015  . CKD (chronic kidney disease), stage III ([place]) 03/06/2015  . Demand ischemia ([place]) 03/05/2015  . Gastric ulcer   . Gastritis 03/06/2015  . Hypertension   . IDA (iron deficiency anemia)   . Thyroid disease     Patient Active Problem List   Diagnosis Date Noted  . Paroxysmal atrial fibrillation ([place]) 04/29/2019  . Mild malnutrition ([place]) 04/01/2017  . Carotid artery stenosis 03/06/2015  . CKD (chronic kidney disease), stage III ([place]) 03/06/2015  . Gastritis 03/06/2015  . Elevated TSH 03/05/2015  . History of gastric ulcer   . Absolute anemia     Past Surgical History:  Procedure Laterality Date  . ABDOMINAL HYSTERECTOMY    . ESOPHAGOGASTRODUODENOSCOPY (EGD) WITH PROPOFOL N/A 03/03/2015   Procedure: ESOPHAGOGASTRODUODENOSCOPY (EGD) WITH  PROPOFOL;  Surgeon: Jerene Bears, MD;  Location: [facility] ENDOSCOPY;  Service: Endoscopy;  Laterality: N/A;     OB History   No obstetric history on file.     Family History  Problem Relation Age of Onset  . Lupus Mother   . Kidney failure Mother   . Heart attack Father   . Colon cancer Brother   . Pancreatic cancer Sister     Social History   Tobacco Use  . Smoking status: Former Smoker    Types: Cigarettes    Quit date: 03/09/1965    Years since quitting: 55.4  . Smokeless tobacco: Never Used  Substance Use Topics  . Alcohol use: No    Alcohol/week: 0.0 standard drinks  . Drug use: No    Home Medications Prior to Admission medications   Medication Sig Start Date End Date Taking? Authorizing Provider  acetaminophen (TYLENOL) 500 MG tablet Take 500 mg by mouth every 6 (six) hours as needed for mild pain.    [provider]  Ascorbic Acid (VITAMIN C PO) Take 500 mg by mouth daily.    [provider]  aspirin EC 81 MG tablet Take 81 mg by mouth daily.    [provider]  atenolol (TENORMIN) 50 MG tablet TAKE ONE TABLET BY MOUTH DAILY FOR BLOOD PRESSURE 05/21/20   McClanahan, Danton Sewer, NP  IRON, FERROUS SULFATE, PO Take 65 mg by mouth  daily.    [provider]  levothyroxine (SYNTHROID) 25 MCG tablet Take  1 tablet  Daily  on an empty stomach with only water for 30 minutes & no Antacid meds, Calcium or Magnesium for 4 hours & avoid Biotin 07/05/20   Unk Pinto, MD  pantoprazole (PROTONIX) 40 MG tablet TAKE ONE TABLET BY MOUTH DAILY FOR INDIGESTION AND HEARTBURN 12/15/19   Vladimir Crofts, PA-C  vitamin B-12 (CYANOCOBALAMIN) 100 MCG tablet Take 100 mcg by mouth daily.    [provider]  VITAMIN D, CHOLECALCIFEROL, PO Take by mouth.    [provider]    Allergies    Patient has no known allergies.  Review of Systems   Review of Systems  Unable to perform ROS: Patient unresponsive    Physical Exam Updated Vital  Signs BP 114/75   Pulse (!) 115   Resp (!) 64   Ht _0  (1.575 m)   Wt 46.7 kg   SpO2 (!) 70%   BMI 18.83 kg/m   Physical Exam Constitutional:      Appearance: She is ill-appearing and toxic-appearing.     Interventions: She is intubated.  HENT:     Head: Normocephalic and atraumatic.     Right Ear: External ear normal.     Left Ear: External ear normal.     Nose: Nose normal.     Mouth/Throat:     Mouth: Mucous membranes are dry.  Eyes:     General:        Right eye: No discharge.        Left eye: No discharge.     Comments: Pupils equal and not reactive  Cardiovascular:     Rate and Rhythm: Regular rhythm. Tachycardia present.     Comments: Pulses present Pulmonary:     Effort: She is intubated.     Comments: King airway in place  Bag ventilation with ambu 100% O2 Bilateral breath sounds Abdominal:     General: Abdomen is flat.     Palpations: Abdomen is soft.  Musculoskeletal:     Right lower leg: No edema.     Left lower leg: No edema.  Skin:    General: Skin is warm and dry.  Neurological:     Mental Status: She is unresponsive.     GCS: GCS eye subscore is 1. GCS verbal subscore is 1. GCS motor subscore is 1.     Comments: GCS 3T     ED Results / Procedures / Treatments   Labs (all labs ordered are listed, but only abnormal results are displayed) Labs Reviewed  COMPREHENSIVE METABOLIC PANEL  CBC  URINALYSIS, ROUTINE W REFLEX MICROSCOPIC  PROTIME-INR  I-STAT CHEM 8, ED  TROPONIN I (HIGH SENSITIVITY)  TROPONIN I (HIGH SENSITIVITY)    EKG None  Radiology No results found.  Procedures Procedure Name: Intubation Date/Time: 07/02/2020 10:33 PM Performed by: Stedman [place], [place], MD Pre-anesthesia Checklist: Patient identified, Emergency Drugs available, Timeout performed, Suction available and Patient being monitored Oxygen Delivery Method: Ambu bag Preoxygenation: Pre-oxygenation with 100% oxygen Induction Type: IV induction Ventilation: Mask  ventilation without difficulty Laryngoscope Size: Mac and 4 Grade View: Grade IV Tube size: 7.5 mm Number of attempts: 1 Placement Confirmation: ETT inserted through vocal cords under direct vision,  CO2 detector and Breath sounds checked- equal and bilateral Secured at: 25 cm Tube secured with: ETT holder       Medications Ordered in ED Medications  sodium chloride 0.9 % bolus 1,000  mL (has no administration in time range)  EPINEPHrine (ADRENALIN) 1 MG/10ML injection (1 mg Intravenous Given 07/12/2020 07-04-2132)  EPINEPHrine (ADRENALIN) 1 MG/10ML injection (1 mg Intravenous Given 07/18/2020 07-04-53)  0.9 %  sodium chloride infusion ( Intravenous Stopped 07/20/2020 2240)  sodium bicarbonate injection (50 mEq Intravenous Given 07/14/2020 07/04/52)    ED Course  I have reviewed the triage vital signs and the nursing notes.  Pertinent labs & imaging results that were available during my care of the patient were reviewed by me and considered in my medical decision making (see chart for details).    MDM Rules/Calculators/A&P                          Patient is an 87 year old female who arrived as a post cardiac arrest in critical condition.  On arrival to the emergency department, she was transferred over to the bed, placed on Zoll pads, and hooked up to the monitor.  Patient did not have a definitive airway intact.  Patient immediately intubated via direct laryngoscopy as above.  She was placed on the ventilator.  OG tube placed.  Bilateral peripheral IVs placed.  Peripheral blood pressure noted to be 80s over 60s.  Fluid bolus started.  While providers were still in the room, patient with heart rate that started in the 120s decreased down to 90s and 70s and 50s.  Pulses checked for 15 seconds and there was no definitive pulse palpated in the bilateral femoral or carotid arteries.  CPR was initiated.  Patient received 3 doses of epinephrine with multiple rounds of CPR.  3 different pulse checks without  pulse.  It was discussed with the family what patient would want in a situation like this.  Family reported the patient probably would not have wanted EMS called to begin with, however they wanted to give her a chance.  After further discussion, it was decided that after the next round of CPR we would stop our efforts and let patient pass peacefully.  On the last pulse check, no pulse was palpated, very little cardiac activity noted on bedside ultrasound.  Patient was taken off the monitor and time of death was noted to be July 05, 2203.   Final Clinical Impression(s) / ED Diagnoses Final diagnoses:  Cardiac arrest [facility])    Rx / [place] Orders ED Discharge Orders    None       Sarae Nicholes, [place], MD 2020/08/15 Adelfa Koh    Lajean Saver, MD 07/23/20 1410

## 2020-07-21 NOTE — ED Provider Notes (Incomplete)
[facility] EMERGENCY DEPARTMENT Provider Note   CSN: [id number] Arrival date & time: 07/25/2020  2134     History Chief Complaint  Patient presents with  . Cardiac Arrest    Erica Owen is a 87 y.o. female.  Patient is a 87 year old female who presents as a post cardiac arrest.  It is reported that patient was talking with her daughter at home.  All of a sudden she started gasping for air and apneic breathing.  He quickly became unresponsive and stopped breathing.  They immediately called EMS.  CPR was started by family at home.  When EMS got there they resumed CPR and noted her rhythm to be asystole.  Patient got 3 doses of epi with EMS while doing CPR.  EMS, pulses back while in route.  At 1 point they were transcutaneously pacing patient.  On arrival, patient with strong pulses noted at the bilateral carotids.  She was moved over to our bed.  Placed on ZOLL pads.  She is unresponsive.  No definitive airway intact.  Critically ill.    Past Medical History:  Diagnosis Date  . Abnormal EKG 03/02/2015  . Acute blood loss anemia 03/02/2015  . Acute encephalopathy 03/02/2015  . Arthritis   . Carotid artery stenosis 03/06/2015  . CKD (chronic kidney disease), stage III ([place]) 03/06/2015  . Demand ischemia ([place]) 03/05/2015  . Gastric ulcer   . Gastritis 03/06/2015  . Hypertension   . IDA (iron deficiency anemia)   . Thyroid disease     Patient Active Problem List   Diagnosis Date Noted  . Paroxysmal atrial fibrillation ([place]) 04/29/2019  . Mild malnutrition ([place]) 04/01/2017  . Carotid artery stenosis 03/06/2015  . CKD (chronic kidney disease), stage III ([place]) 03/06/2015  . Gastritis 03/06/2015  . Elevated TSH 03/05/2015  . History of gastric ulcer   . Absolute anemia     Past Surgical History:  Procedure Laterality Date  . ABDOMINAL HYSTERECTOMY    . ESOPHAGOGASTRODUODENOSCOPY (EGD) WITH PROPOFOL N/A 03/03/2015   Procedure: ESOPHAGOGASTRODUODENOSCOPY (EGD) WITH  PROPOFOL;  Surgeon: Jerene Bears, MD;  Location: [facility] ENDOSCOPY;  Service: Endoscopy;  Laterality: N/A;     OB History   No obstetric history on file.     Family History  Problem Relation Age of Onset  . Lupus Mother   . Kidney failure Mother   . Heart attack Father   . Colon cancer Brother   . Pancreatic cancer Sister     Social History   Tobacco Use  . Smoking status: Former Smoker    Types: Cigarettes    Quit date: 03/09/1965    Years since quitting: 55.4  . Smokeless tobacco: Never Used  Substance Use Topics  . Alcohol use: No    Alcohol/week: 0.0 standard drinks  . Drug use: No    Home Medications Prior to Admission medications   Medication Sig Start Date End Date Taking? Authorizing Provider  acetaminophen (TYLENOL) 500 MG tablet Take 500 mg by mouth every 6 (six) hours as needed for mild pain.    [provider]  Ascorbic Acid (VITAMIN C PO) Take 500 mg by mouth daily.    [provider]  aspirin EC 81 MG tablet Take 81 mg by mouth daily.    [provider]  atenolol (TENORMIN) 50 MG tablet TAKE ONE TABLET BY MOUTH DAILY FOR BLOOD PRESSURE 05/21/20   McClanahan, Danton Sewer, NP  IRON, FERROUS SULFATE, PO Take 65 mg by mouth  daily.    [provider]  levothyroxine (SYNTHROID) 25 MCG tablet Take  1 tablet  Daily  on an empty stomach with only water for 30 minutes & no Antacid meds, Calcium or Magnesium for 4 hours & avoid Biotin 07/05/20   Unk Pinto, MD  pantoprazole (PROTONIX) 40 MG tablet TAKE ONE TABLET BY MOUTH DAILY FOR INDIGESTION AND HEARTBURN 12/15/19   Vladimir Crofts, PA-C  vitamin B-12 (CYANOCOBALAMIN) 100 MCG tablet Take 100 mcg by mouth daily.    [provider]  VITAMIN D, CHOLECALCIFEROL, PO Take by mouth.    [provider]    Allergies    Patient has no known allergies.  Review of Systems   Review of Systems  Unable to perform ROS: Patient unresponsive    Physical Exam Updated Vital  Signs BP 114/75   Pulse (!) 115   Resp (!) 64   Ht _0  (1.575 m)   Wt 46.7 kg   SpO2 (!) 70%   BMI 18.83 kg/m   Physical Exam Constitutional:      Appearance: She is ill-appearing and toxic-appearing.     Interventions: She is intubated.  HENT:     Head: Normocephalic and atraumatic.     Right Ear: External ear normal.     Left Ear: External ear normal.     Nose: Nose normal.     Mouth/Throat:     Mouth: Mucous membranes are dry.  Eyes:     General:        Right eye: No discharge.        Left eye: No discharge.     Comments: Pupils equal and not reactive  Cardiovascular:     Rate and Rhythm: Regular rhythm. Tachycardia present.     Comments: Pulses present Pulmonary:     Effort: She is intubated.     Comments: King airway in place  Bag ventilation with ambu 100% O2 Bilateral breath sounds Abdominal:     General: Abdomen is flat.     Palpations: Abdomen is soft.  Musculoskeletal:     Right lower leg: No edema.     Left lower leg: No edema.  Skin:    General: Skin is warm and dry.  Neurological:     Mental Status: She is unresponsive.     GCS: GCS eye subscore is 1. GCS verbal subscore is 1. GCS motor subscore is 1.     Comments: GCS 3T     ED Results / Procedures / Treatments   Labs (all labs ordered are listed, but only abnormal results are displayed) Labs Reviewed  COMPREHENSIVE METABOLIC PANEL  CBC  URINALYSIS, ROUTINE W REFLEX MICROSCOPIC  PROTIME-INR  I-STAT CHEM 8, ED  TROPONIN I (HIGH SENSITIVITY)  TROPONIN I (HIGH SENSITIVITY)    EKG None  Radiology No results found.  Procedures Procedure Name: Intubation Date/Time: 07/14/2020 10:33 PM Performed by: Kugler, [place], MD Pre-anesthesia Checklist: Patient identified, Emergency Drugs available, Timeout performed, Suction available and Patient being monitored Oxygen Delivery Method: Ambu bag Preoxygenation: Pre-oxygenation with 100% oxygen Induction Type: IV induction Ventilation: Mask  ventilation without difficulty Laryngoscope Size: Mac and 4 Grade View: Grade IV Tube size: 7.5 mm Number of attempts: 1 Placement Confirmation: ETT inserted through vocal cords under direct vision,  CO2 detector and Breath sounds checked- equal and bilateral Secured at: 25 cm Tube secured with: ETT holder       Medications Ordered in ED Medications  sodium chloride 0.9 % bolus 1,000  mL (has no administration in time range)  EPINEPHrine (ADRENALIN) 1 MG/10ML injection (1 mg Intravenous Given 07/01/2020 2134)  EPINEPHrine (ADRENALIN) 1 MG/10ML injection (1 mg Intravenous Given 07/10/2020 2155)  0.9 %  sodium chloride infusion ( Intravenous Stopped 07/07/2020 2240)  sodium bicarbonate injection (50 mEq Intravenous Given 07/18/2020 2154)    ED Course  I have reviewed the triage vital signs and the nursing notes.  Pertinent labs & imaging results that were available during my care of the patient were reviewed by me and considered in my medical decision making (see chart for details).    MDM Rules/Calculators/A&P                          Patient is an 87 year old female who arrived as a post cardiac arrest in critical condition.  On arrival to the emergency department, she was transferred over to the bed, placed on Zoll pads, and hooked up to the monitor.  Patient did not have a definitive airway intact.  Patient immediately intubated via direct laryngoscopy as above.  She was placed on the ventilator.  OG tube placed.  Bilateral peripheral IVs placed.  Peripheral blood pressure noted to be 80s over 60s.  Fluid bolus started.  While providers were still in the room, patient with heart rate that started in the 120s decreased down to 90s and 70s and 50s.  Pulses checked for 15 seconds and there was no definitive pulse palpated in the bilateral femoral or carotid arteries.  CPR was initiated.  Patient received 3 doses of epinephrine with multiple rounds of CPR.  3 different pulse checks initiated  not finding any pulse.  It was discussed with the family what patient would want in a situation like this.  Family reported the patient probably would not have wanted EMS called to begin with, however they wanted to give her a chance.  After further discussion, it was decided that after the next round of CPR we would   Final Clinical Impression(s) / ED Diagnoses Final diagnoses:  None    Rx / [place] Orders ED Discharge Orders    None

## 2020-07-29 NOTE — ED Notes (Signed)
Pt BIB [facility] from home, pt had witnessed arrest with family. Asystolic with EMS, given total 3 epis, achieved ROSC, started on an epi drip. Pt lost pulses en route to this ED, CPR in progress on arrival.

## 2020-07-29 DEATH — deceased

## 2020-12-19 ENCOUNTER — Encounter: Payer: Medicare Other | Admitting: Adult Health

## 2021-03-16 ENCOUNTER — Ambulatory Visit: Payer: Medicare Other | Admitting: Adult Health Nurse Practitioner
